# Patient Record
Sex: Male | Born: 1950 | Race: White | Hispanic: No | Marital: Married | Smoking: Former smoker
Health system: Southern US, Community
[De-identification: ages and names within clinical notes are randomized; demographics above are authoritative.]

## PROBLEM LIST (undated history)

## (undated) DIAGNOSIS — M1990 Unspecified osteoarthritis, unspecified site: Secondary | ICD-10-CM

## (undated) DIAGNOSIS — I10 Essential (primary) hypertension: Secondary | ICD-10-CM

## (undated) DIAGNOSIS — K219 Gastro-esophageal reflux disease without esophagitis: Secondary | ICD-10-CM

## (undated) DIAGNOSIS — I499 Cardiac arrhythmia, unspecified: Secondary | ICD-10-CM

## (undated) DIAGNOSIS — Z9889 Other specified postprocedural states: Secondary | ICD-10-CM

## (undated) DIAGNOSIS — Z8719 Personal history of other diseases of the digestive system: Secondary | ICD-10-CM

## (undated) DIAGNOSIS — R112 Nausea with vomiting, unspecified: Secondary | ICD-10-CM

## (undated) DIAGNOSIS — I219 Acute myocardial infarction, unspecified: Secondary | ICD-10-CM

## (undated) DIAGNOSIS — I2510 Atherosclerotic heart disease of native coronary artery without angina pectoris: Secondary | ICD-10-CM

## (undated) DIAGNOSIS — J45909 Unspecified asthma, uncomplicated: Secondary | ICD-10-CM

---

## 2015-03-25 DIAGNOSIS — S61305A Unspecified open wound of left ring finger with damage to nail, initial encounter: Secondary | ICD-10-CM | POA: Diagnosis not present

## 2015-03-25 DIAGNOSIS — S62609A Fracture of unspecified phalanx of unspecified finger, initial encounter for closed fracture: Secondary | ICD-10-CM | POA: Diagnosis not present

## 2015-07-26 DIAGNOSIS — Z23 Encounter for immunization: Secondary | ICD-10-CM | POA: Diagnosis not present

## 2016-07-31 DIAGNOSIS — Z23 Encounter for immunization: Secondary | ICD-10-CM | POA: Diagnosis not present

## 2016-11-16 ENCOUNTER — Encounter (HOSPITAL_COMMUNITY)
Admission: EM | Disposition: A | Discharge status: Home / Self Care | Payer: Self-pay | Source: Other Acute Inpatient Hospital | Attending: Thoracic Surgery (Cardiothoracic Vascular Surgery)

## 2016-11-16 ENCOUNTER — Encounter (HOSPITAL_COMMUNITY): Payer: Self-pay | Admitting: Physician Assistant

## 2016-11-16 ENCOUNTER — Inpatient Hospital Stay (HOSPITAL_COMMUNITY)
Admission: EM | Admit: 2016-11-16 | Discharge: 2016-11-24 | DRG: 234 | Disposition: A | Discharge status: Home / Self Care | Payer: Medicare Other | Source: Other Acute Inpatient Hospital | Attending: Thoracic Surgery (Cardiothoracic Vascular Surgery) | Admitting: Thoracic Surgery (Cardiothoracic Vascular Surgery)

## 2016-11-16 ENCOUNTER — Inpatient Hospital Stay (HOSPITAL_COMMUNITY): Payer: Medicare Other

## 2016-11-16 DIAGNOSIS — Z8249 Family history of ischemic heart disease and other diseases of the circulatory system: Secondary | ICD-10-CM

## 2016-11-16 DIAGNOSIS — I2510 Atherosclerotic heart disease of native coronary artery without angina pectoris: Secondary | ICD-10-CM | POA: Diagnosis present

## 2016-11-16 DIAGNOSIS — I10 Essential (primary) hypertension: Secondary | ICD-10-CM | POA: Diagnosis not present

## 2016-11-16 DIAGNOSIS — Z01810 Encounter for preprocedural cardiovascular examination: Secondary | ICD-10-CM

## 2016-11-16 DIAGNOSIS — I25110 Atherosclerotic heart disease of native coronary artery with unstable angina pectoris: Secondary | ICD-10-CM | POA: Diagnosis not present

## 2016-11-16 DIAGNOSIS — E785 Hyperlipidemia, unspecified: Secondary | ICD-10-CM | POA: Diagnosis present

## 2016-11-16 DIAGNOSIS — K5900 Constipation, unspecified: Secondary | ICD-10-CM | POA: Diagnosis not present

## 2016-11-16 DIAGNOSIS — R0789 Other chest pain: Secondary | ICD-10-CM | POA: Diagnosis not present

## 2016-11-16 DIAGNOSIS — J9811 Atelectasis: Secondary | ICD-10-CM

## 2016-11-16 DIAGNOSIS — I213 ST elevation (STEMI) myocardial infarction of unspecified site: Secondary | ICD-10-CM | POA: Diagnosis present

## 2016-11-16 DIAGNOSIS — I2111 ST elevation (STEMI) myocardial infarction involving right coronary artery: Secondary | ICD-10-CM | POA: Diagnosis present

## 2016-11-16 DIAGNOSIS — R600 Localized edema: Secondary | ICD-10-CM | POA: Diagnosis not present

## 2016-11-16 DIAGNOSIS — E8770 Fluid overload, unspecified: Secondary | ICD-10-CM | POA: Diagnosis not present

## 2016-11-16 DIAGNOSIS — I471 Supraventricular tachycardia: Secondary | ICD-10-CM | POA: Diagnosis not present

## 2016-11-16 DIAGNOSIS — I480 Paroxysmal atrial fibrillation: Secondary | ICD-10-CM | POA: Diagnosis not present

## 2016-11-16 DIAGNOSIS — K219 Gastro-esophageal reflux disease without esophagitis: Secondary | ICD-10-CM | POA: Diagnosis present

## 2016-11-16 DIAGNOSIS — Z951 Presence of aortocoronary bypass graft: Secondary | ICD-10-CM | POA: Diagnosis not present

## 2016-11-16 DIAGNOSIS — R338 Other retention of urine: Secondary | ICD-10-CM | POA: Diagnosis not present

## 2016-11-16 DIAGNOSIS — Z79899 Other long term (current) drug therapy: Secondary | ICD-10-CM

## 2016-11-16 DIAGNOSIS — Z09 Encounter for follow-up examination after completed treatment for conditions other than malignant neoplasm: Secondary | ICD-10-CM

## 2016-11-16 DIAGNOSIS — N401 Enlarged prostate with lower urinary tract symptoms: Secondary | ICD-10-CM | POA: Diagnosis present

## 2016-11-16 DIAGNOSIS — R079 Chest pain, unspecified: Secondary | ICD-10-CM

## 2016-11-16 DIAGNOSIS — K449 Diaphragmatic hernia without obstruction or gangrene: Secondary | ICD-10-CM | POA: Diagnosis present

## 2016-11-16 DIAGNOSIS — I2119 ST elevation (STEMI) myocardial infarction involving other coronary artery of inferior wall: Principal | ICD-10-CM | POA: Diagnosis present

## 2016-11-16 DIAGNOSIS — D62 Acute posthemorrhagic anemia: Secondary | ICD-10-CM | POA: Diagnosis not present

## 2016-11-16 DIAGNOSIS — I340 Nonrheumatic mitral (valve) insufficiency: Secondary | ICD-10-CM | POA: Diagnosis not present

## 2016-11-16 HISTORY — DX: Unspecified asthma, uncomplicated: J45.909

## 2016-11-16 HISTORY — PX: LEFT HEART CATH AND CORONARY ANGIOGRAPHY: CATH118249

## 2016-11-16 HISTORY — DX: Essential (primary) hypertension: I10

## 2016-11-16 HISTORY — DX: Gastro-esophageal reflux disease without esophagitis: K21.9

## 2016-11-16 LAB — COMPREHENSIVE METABOLIC PANEL
ALBUMIN: 3.9 g/dL (ref 3.5–5.0)
ALK PHOS: 75 U/L (ref 38–126)
ALT: 25 U/L (ref 17–63)
ANION GAP: 8 (ref 5–15)
AST: 35 U/L (ref 15–41)
BILIRUBIN TOTAL: 0.8 mg/dL (ref 0.3–1.2)
BUN: 17 mg/dL (ref 6–20)
CALCIUM: 8.5 mg/dL — AB (ref 8.9–10.3)
CO2: 23 mmol/L (ref 22–32)
Chloride: 107 mmol/L (ref 101–111)
Creatinine, Ser: 1.25 mg/dL — ABNORMAL HIGH (ref 0.61–1.24)
GFR calc non Af Amer: 58 mL/min — ABNORMAL LOW (ref >60)
Glucose, Bld: 126 mg/dL — ABNORMAL HIGH (ref 65–99)
POTASSIUM: 3.9 mmol/L (ref 3.5–5.1)
Sodium: 138 mmol/L (ref 135–145)
TOTAL PROTEIN: 6.5 g/dL (ref 6.5–8.1)

## 2016-11-16 LAB — POCT I-STAT, CHEM 8
BUN: 19 mg/dL (ref 6–20)
CHLORIDE: 105 mmol/L (ref 101–111)
CREATININE: 1.2 mg/dL (ref 0.61–1.24)
Calcium, Ion: 1.21 mmol/L (ref 1.15–1.40)
GLUCOSE: 129 mg/dL — AB (ref 65–99)
HCT: 37 % — ABNORMAL LOW (ref 39.0–52.0)
Hemoglobin: 12.6 g/dL — ABNORMAL LOW (ref 13.0–17.0)
POTASSIUM: 3.8 mmol/L (ref 3.5–5.1)
Sodium: 141 mmol/L (ref 135–145)
TCO2: 24 mmol/L (ref 0–100)

## 2016-11-16 LAB — TYPE AND SCREEN
ABO/RH(D): O POS
Antibody Screen: NEGATIVE

## 2016-11-16 LAB — LIPID PANEL
CHOL/HDL RATIO: 6.6 ratio
Cholesterol: 191 mg/dL (ref 0–200)
HDL: 29 mg/dL — AB (ref >40)
LDL Cholesterol: 137 mg/dL — ABNORMAL HIGH (ref 0–99)
Triglycerides: 127 mg/dL (ref <150)
VLDL: 25 mg/dL (ref 0–40)

## 2016-11-16 LAB — VAS US DOPPLER PRE CABG
LCCADDIAS: -17 cm/s
LCCADSYS: -63 cm/s
LCCAPDIAS: -22 cm/s
LEFT ECA DIAS: -17 cm/s
LEFT VERTEBRAL DIAS: 13 cm/s
LICADDIAS: -18 cm/s
LICADSYS: -53 cm/s
LICAPDIAS: -14 cm/s
LICAPSYS: -62 cm/s
Left CCA prox sys: -98 cm/s
RCCAPSYS: 76 cm/s
RIGHT ECA DIAS: -16 cm/s
RIGHT VERTEBRAL DIAS: 20 cm/s
Right CCA prox dias: 14 cm/s
Right cca dist sys: -68 cm/s

## 2016-11-16 LAB — URINALYSIS, ROUTINE W REFLEX MICROSCOPIC
Bilirubin Urine: NEGATIVE
GLUCOSE, UA: NEGATIVE mg/dL
HGB URINE DIPSTICK: NEGATIVE
KETONES UR: NEGATIVE mg/dL
Leukocytes, UA: NEGATIVE
Nitrite: NEGATIVE
PH: 6 (ref 5.0–8.0)
PROTEIN: NEGATIVE mg/dL
Specific Gravity, Urine: 1.02 (ref 1.005–1.030)

## 2016-11-16 LAB — CBC
HEMATOCRIT: 38.9 % — AB (ref 39.0–52.0)
Hemoglobin: 13.2 g/dL (ref 13.0–17.0)
MCH: 29.7 pg (ref 26.0–34.0)
MCHC: 33.9 g/dL (ref 30.0–36.0)
MCV: 87.6 fL (ref 78.0–100.0)
PLATELETS: 182 10*3/uL (ref 150–400)
RBC: 4.44 MIL/uL (ref 4.22–5.81)
RDW: 12.7 % (ref 11.5–15.5)
WBC: 8.6 10*3/uL (ref 4.0–10.5)

## 2016-11-16 LAB — TSH: TSH: 1.585 u[IU]/mL (ref 0.350–4.500)

## 2016-11-16 LAB — PROTIME-INR
INR: 1.11
INR: 0.98
PROTHROMBIN TIME: 12.9 s (ref 11.4–15.2)
Prothrombin Time: 14.3 seconds (ref 11.4–15.2)

## 2016-11-16 LAB — POCT ACTIVATED CLOTTING TIME: Activated Clotting Time: 257 seconds

## 2016-11-16 LAB — CARDIAC CATHETERIZATION: Cath EF Quantitative: 45 %

## 2016-11-16 LAB — TROPONIN I
Troponin I: 1.31 ng/mL (ref <0.03)
Troponin I: 8.89 ng/mL (ref <0.03)
Troponin I: 23.58 ng/mL (ref <0.03)

## 2016-11-16 LAB — MRSA PCR SCREENING: MRSA BY PCR: NEGATIVE

## 2016-11-16 LAB — APTT

## 2016-11-16 LAB — ABO/RH: ABO/RH(D): O POS

## 2016-11-16 SURGERY — LEFT HEART CATH AND CORONARY ANGIOGRAPHY

## 2016-11-16 MED ORDER — EPINEPHRINE PF 1 MG/ML IJ SOLN
0.0000 ug/min | INTRAVENOUS | Status: DC
  Filled 2016-11-16: qty 4

## 2016-11-16 MED ORDER — ACETAMINOPHEN 325 MG PO TABS: 650.0000 mg | ORAL_TABLET | ORAL | Status: DC | PRN

## 2016-11-16 MED ORDER — NITROGLYCERIN IN D5W 200-5 MCG/ML-% IV SOLN
2.0000 ug/min | INTRAVENOUS | Status: AC
  Administered 2016-11-17: 10 ug/min via INTRAVENOUS
  Filled 2016-11-16: qty 250

## 2016-11-16 MED ORDER — IOPAMIDOL (ISOVUE-370) INJECTION 76%
INTRAVENOUS | Status: DC | PRN
  Administered 2016-11-16: 75 mL via INTRA_ARTERIAL

## 2016-11-16 MED ORDER — HEPARIN (PORCINE) IN NACL 2-0.9 UNIT/ML-% IJ SOLN
INTRAMUSCULAR | Status: DC | PRN
  Administered 2016-11-16: 1000 mL

## 2016-11-16 MED ORDER — CHLORHEXIDINE GLUCONATE CLOTH 2 % EX PADS
6.0000 | MEDICATED_PAD | Freq: Once | CUTANEOUS | Status: AC
  Administered 2016-11-17: 6 via TOPICAL

## 2016-11-16 MED ORDER — HEPARIN (PORCINE) IN NACL 2-0.9 UNIT/ML-% IJ SOLN
INTRAMUSCULAR | Status: AC
Start: 2016-11-16 — End: 2016-11-16
  Filled 2016-11-16: qty 1000

## 2016-11-16 MED ORDER — METOPROLOL TARTRATE 25 MG PO TABS
25.0000 mg | ORAL_TABLET | Freq: Two times a day (BID) | ORAL | Status: DC
  Administered 2016-11-16: 25 mg via ORAL
  Filled 2016-11-16: qty 1

## 2016-11-16 MED ORDER — DEXTROSE 5 % IV SOLN
750.0000 mg | INTRAVENOUS | Status: DC
  Filled 2016-11-16: qty 750

## 2016-11-16 MED ORDER — CHLORHEXIDINE GLUCONATE CLOTH 2 % EX PADS
6.0000 | MEDICATED_PAD | Freq: Once | CUTANEOUS | Status: AC
  Administered 2016-11-16: 6 via TOPICAL

## 2016-11-16 MED ORDER — NITROGLYCERIN IN D5W 200-5 MCG/ML-% IV SOLN: 0.0000 ug/min | INTRAVENOUS | Status: DC

## 2016-11-16 MED ORDER — BISACODYL 5 MG PO TBEC
5.0000 mg | DELAYED_RELEASE_TABLET | Freq: Once | ORAL | Status: AC
  Administered 2016-11-16: 5 mg via ORAL
  Filled 2016-11-16: qty 1

## 2016-11-16 MED ORDER — IOPAMIDOL (ISOVUE-370) INJECTION 76%
INTRAVENOUS | Status: AC
  Filled 2016-11-16: qty 125

## 2016-11-16 MED ORDER — ALPRAZOLAM 0.25 MG PO TABS: 0.2500 mg | ORAL_TABLET | Freq: Two times a day (BID) | ORAL | Status: DC | PRN

## 2016-11-16 MED ORDER — NITROGLYCERIN 0.4 MG SL SUBL: 0.4000 mg | SUBLINGUAL_TABLET | SUBLINGUAL | Status: DC | PRN

## 2016-11-16 MED ORDER — TRANEXAMIC ACID 1000 MG/10ML IV SOLN
1.5000 mg/kg/h | INTRAVENOUS | Status: AC
  Administered 2016-11-17: 1.5 mg/kg/h via INTRAVENOUS
  Filled 2016-11-16: qty 25

## 2016-11-16 MED ORDER — ATORVASTATIN CALCIUM 80 MG PO TABS
80.0000 mg | ORAL_TABLET | Freq: Every day | ORAL | Status: DC
  Administered 2016-11-16 – 2016-11-23 (×7): 80 mg via ORAL
  Filled 2016-11-16 (×7): qty 1

## 2016-11-16 MED ORDER — ASPIRIN 81 MG PO CHEW: 81.0000 mg | CHEWABLE_TABLET | Freq: Every day | ORAL | Status: DC

## 2016-11-16 MED ORDER — VERAPAMIL HCL 2.5 MG/ML IV SOLN
INTRAVENOUS | Status: DC | PRN
  Administered 2016-11-16: 10 mL via INTRA_ARTERIAL

## 2016-11-16 MED ORDER — ZOLPIDEM TARTRATE 5 MG PO TABS: 5.0000 mg | ORAL_TABLET | Freq: Every evening | ORAL | Status: DC | PRN

## 2016-11-16 MED ORDER — LIDOCAINE HCL (PF) 1 % IJ SOLN
INTRAMUSCULAR | Status: DC | PRN
  Administered 2016-11-16: 2 mL

## 2016-11-16 MED ORDER — NITROGLYCERIN IN D5W 200-5 MCG/ML-% IV SOLN
INTRAVENOUS | Status: DC | PRN
  Administered 2016-11-16: 10 ug/min via INTRAVENOUS

## 2016-11-16 MED ORDER — DIAZEPAM 5 MG PO TABS
5.0000 mg | ORAL_TABLET | Freq: Once | ORAL | Status: AC
  Administered 2016-11-17: 5 mg via ORAL
  Filled 2016-11-16: qty 1

## 2016-11-16 MED ORDER — DEXMEDETOMIDINE HCL IN NACL 400 MCG/100ML IV SOLN
0.1000 ug/kg/h | INTRAVENOUS | Status: AC
  Administered 2016-11-17: .3 ug/kg/h via INTRAVENOUS
  Filled 2016-11-16: qty 100

## 2016-11-16 MED ORDER — SODIUM CHLORIDE 0.9 % IV SOLN
30.0000 ug/min | INTRAVENOUS | Status: AC
  Administered 2016-11-17: 20 ug/min via INTRAVENOUS
  Administered 2016-11-17: 10 ug/min via INTRAVENOUS
  Filled 2016-11-16: qty 2

## 2016-11-16 MED ORDER — ONDANSETRON HCL 4 MG/2ML IJ SOLN: 4.0000 mg | Freq: Four times a day (QID) | INTRAMUSCULAR | Status: DC | PRN

## 2016-11-16 MED ORDER — NITROGLYCERIN IN D5W 200-5 MCG/ML-% IV SOLN
INTRAVENOUS | Status: AC
  Filled 2016-11-16: qty 250

## 2016-11-16 MED ORDER — DOPAMINE-DEXTROSE 3.2-5 MG/ML-% IV SOLN
0.0000 ug/kg/min | INTRAVENOUS | Status: DC
  Filled 2016-11-16: qty 250

## 2016-11-16 MED ORDER — PLASMA-LYTE 148 IV SOLN
INTRAVENOUS | Status: AC
  Administered 2016-11-17: 500 mL
  Filled 2016-11-16: qty 2.5

## 2016-11-16 MED ORDER — MORPHINE SULFATE (PF) 2 MG/ML IV SOLN: 2.0000 mg | INTRAVENOUS | Status: DC | PRN

## 2016-11-16 MED ORDER — SODIUM CHLORIDE 0.9 % IV SOLN
INTRAVENOUS | Status: DC | PRN
  Administered 2016-11-16: 150 mL/h via INTRAVENOUS

## 2016-11-16 MED ORDER — CHLORHEXIDINE GLUCONATE 0.12 % MT SOLN
15.0000 mL | Freq: Once | OROMUCOSAL | Status: AC
  Administered 2016-11-17: 15 mL via OROMUCOSAL
  Filled 2016-11-16: qty 15

## 2016-11-16 MED ORDER — LIDOCAINE HCL (PF) 1 % IJ SOLN
INTRAMUSCULAR | Status: AC
  Filled 2016-11-16: qty 30

## 2016-11-16 MED ORDER — DEXTROSE 5 % IV SOLN
1.5000 g | INTRAVENOUS | Status: AC
  Administered 2016-11-17: .75 g via INTRAVENOUS
  Administered 2016-11-17: 1.5 g via INTRAVENOUS
  Filled 2016-11-16: qty 1.5

## 2016-11-16 MED ORDER — POTASSIUM CHLORIDE 2 MEQ/ML IV SOLN
80.0000 meq | INTRAVENOUS | Status: DC
  Filled 2016-11-16: qty 40

## 2016-11-16 MED ORDER — TRANEXAMIC ACID (OHS) BOLUS VIA INFUSION
15.0000 mg/kg | INTRAVENOUS | Status: AC
  Administered 2016-11-17: 1414.5 mg via INTRAVENOUS
  Filled 2016-11-16: qty 1415

## 2016-11-16 MED ORDER — HEPARIN (PORCINE) IN NACL 100-0.45 UNIT/ML-% IJ SOLN
1150.0000 [IU]/h | INTRAMUSCULAR | Status: DC
  Administered 2016-11-16: 1150 [IU]/h via INTRAVENOUS
  Filled 2016-11-16: qty 250

## 2016-11-16 MED ORDER — SODIUM CHLORIDE 0.9% FLUSH
3.0000 mL | Freq: Two times a day (BID) | INTRAVENOUS | Status: DC
  Administered 2016-11-16: 3 mL via INTRAVENOUS

## 2016-11-16 MED ORDER — SODIUM CHLORIDE 0.9 % IV SOLN
INTRAVENOUS | Status: DC
  Filled 2016-11-16: qty 30

## 2016-11-16 MED ORDER — TRANEXAMIC ACID (OHS) PUMP PRIME SOLUTION
2.0000 mg/kg | INTRAVENOUS | Status: DC
  Filled 2016-11-16: qty 1.89

## 2016-11-16 MED ORDER — NITROGLYCERIN 1 MG/10 ML FOR IR/CATH LAB
INTRA_ARTERIAL | Status: AC
  Filled 2016-11-16: qty 10

## 2016-11-16 MED ORDER — VANCOMYCIN HCL 10 G IV SOLR
1500.0000 mg | INTRAVENOUS | Status: AC
  Administered 2016-11-17: 1500 mg via INTRAVENOUS
  Filled 2016-11-16: qty 1500

## 2016-11-16 MED ORDER — SODIUM CHLORIDE 0.9 % IV SOLN: 250.0000 mL | INTRAVENOUS | Status: DC | PRN

## 2016-11-16 MED ORDER — SODIUM CHLORIDE 0.9 % IV SOLN
INTRAVENOUS | Status: AC
  Administered 2016-11-17: .8 [IU]/h via INTRAVENOUS
  Filled 2016-11-16: qty 2.5

## 2016-11-16 MED ORDER — MAGNESIUM SULFATE 50 % IJ SOLN
40.0000 meq | INTRAMUSCULAR | Status: DC
Start: 2016-11-17 — End: 2016-11-17
  Filled 2016-11-16: qty 10

## 2016-11-16 MED ORDER — METOPROLOL TARTRATE 12.5 MG HALF TABLET: 12.5000 mg | ORAL_TABLET | Freq: Once | ORAL | Status: DC

## 2016-11-16 MED ORDER — HEPARIN SODIUM (PORCINE) 1000 UNIT/ML IJ SOLN
INTRAMUSCULAR | Status: DC | PRN
  Administered 2016-11-16: 5000 [IU] via INTRAVENOUS

## 2016-11-16 MED ORDER — HEPARIN SODIUM (PORCINE) 1000 UNIT/ML IJ SOLN
INTRAMUSCULAR | Status: AC
  Filled 2016-11-16: qty 1

## 2016-11-16 MED ORDER — SODIUM CHLORIDE 0.9% FLUSH: 3.0000 mL | INTRAVENOUS | Status: DC | PRN

## 2016-11-16 MED ORDER — SODIUM CHLORIDE 0.9 % WEIGHT BASED INFUSION
1.0000 mL/kg/h | INTRAVENOUS | Status: DC
  Administered 2016-11-16: 1 mL/kg/h via INTRAVENOUS

## 2016-11-16 MED ORDER — SODIUM CHLORIDE 0.9% FLUSH
3.0000 mL | Freq: Two times a day (BID) | INTRAVENOUS | Status: DC
  Administered 2016-11-16 (×2): 3 mL via INTRAVENOUS

## 2016-11-16 MED ORDER — VERAPAMIL HCL 2.5 MG/ML IV SOLN
INTRAVENOUS | Status: AC
  Filled 2016-11-16: qty 2

## 2016-11-16 SURGICAL SUPPLY — 13 items
CATH EXPO 5F FL3.5 (CATHETERS) ×2 IMPLANT
CATH EXPO 5FR FR4 (CATHETERS) ×2 IMPLANT
CATH INFINITI 5FR ANG PIGTAIL (CATHETERS) ×2 IMPLANT
DEVICE RAD COMP TR BAND LRG (VASCULAR PRODUCTS) ×2 IMPLANT
GLIDESHEATH SLEND SS 6F .021 (SHEATH) ×2 IMPLANT
GUIDEWIRE INQWIRE 1.5J.035X260 (WIRE) ×1 IMPLANT
INQWIRE 1.5J .035X260CM (WIRE) ×2
KIT ENCORE 26 ADVANTAGE (KITS) ×2 IMPLANT
KIT HEART LEFT (KITS) ×2 IMPLANT
PACK CARDIAC CATHETERIZATION (CUSTOM PROCEDURE TRAY) ×2 IMPLANT
SYR MEDRAD MARK V 150ML (SYRINGE) ×2 IMPLANT
TRANSDUCER W/STOPCOCK (MISCELLANEOUS) ×2 IMPLANT
TUBING CIL FLEX 10 FLL-RA (TUBING) ×2 IMPLANT

## 2016-11-16 NOTE — H&P (Signed)
History and Physical   Patient ID: Dennis Paul MRN: [id number], DOB/AGE: 10-14-1950 66 y.o. Date of Encounter: 11/16/2016  Primary Physician: No primary care provider on file. Primary Cardiologist: New  Chief Complaint:  STEMI  HPI: Dennis Paul is a 66 y.o. male with a history of Hypertension and reflux, childhood asthma.  He has recently had an upper respiratory infection, both he and his wife have been sick.  He has had 3 episodes of chest pain in the last week. The first episode started without significant exertion, it was brief and resolved spontaneously. Last p.m., he had another episode that started with mild exertion. It was substernal and associated with some shortness of breath. He stopped what he was doing, rested, and the chest pain resolved. He did not try any medications.  Today, he went out to start his truck, and had onset of substernal chest pain. It reached an 8/10. It radiated to his left arm. It was associated with shortness of breath, diaphoresis and maybe a little nausea. He came back into the house, but the symptoms did not resolve. He went to the [facility] ER, where his ECG was consistent with a STEMI. Treated in the emergency room with 325 mg of aspirin, heparin 5000 units and nitroglycerin paste to the right side of his chest.  His chest pain improved with the medications, but it was still abnormal. He was also having frequent ventricular ectopy. He was transferred emergently to [facility] and taken directly to the Cath Lab.   Past Medical History:  Diagnosis Date  . Asthma    had as a child, wheezes now w/ URI  . GERD (gastroesophageal reflux disease)   . Hiatal hernia   . HTN (hypertension)     Surgical History: No past surgical history on file.   I have reviewed the patient's current medications. Prior to Admission medications   Omeprazole and an unknown medication for his blood pressure, possibly amlodipine 10 mg    Allergies: No known drug  allergies  Social History   Social History  . Marital status: Married    Spouse name: N/A  . Number of children: N/A  . Years of education: N/A   Occupational History  . Retired    Social History Main Topics  . Smoking status: Never Smoker  . Smokeless tobacco: Never Used  . Alcohol use No  . Drug use: No  . Sexual activity: Not on file   Other Topics Concern  . Not on file   Social History Narrative   Lives in [place] with family    Family History  Problem Relation Age of Onset  . Hypertension Mother   . Hypertension Father    Family Status  Relation Status  . Mother Deceased  . Father Deceased    Review of Systems:   Full 14-point review of systems otherwise negative except as noted above.  Physical Exam: Vital signs by EMS, blood pressure 165/105, heart rate 75, respiratory rate 18, blood glucose 126 General: Well developed, well nourished,male in no acute distress. Head: Normocephalic, atraumatic, sclera non-icteric, no xanthomas, nares are without discharge. Dentition: Fair Neck: No carotid bruits. JVD not elevated. No thyromegally Lungs: Good expansion bilaterally. Without rhonchi. Mild expiratory wheezing noted Heart: Regular rate and rhythm with S1 S2.  No S3 or S4.  No murmur, no rubs, or gallops appreciated. Abdomen: Soft, non-tender, non-distended with normoactive bowel sounds. No hepatomegaly. No rebound/guarding. No obvious abdominal masses. Msk:  Strength  and tone appear normal for age. No joint deformities or effusions, no spine or costo-vertebral angle tenderness. Extremities: No clubbing or cyanosis. No edema.  Distal pedal pulses are 2+ in 4 extrem Neuro: Alert and oriented X 3. Moves all extremities spontaneously. No focal deficits noted. Psych:  Responds to questions appropriately with a normal affect. Skin: No rashes or lesions noted  Labs: CMET, CBC and trop[facility] Troponin 0.05, other labs are currently available,  they will fax to the Cath Lab  Radiology/Studies: No results found.   Cardiac Cath:N/A Echo: N/A  ECG: Sinus rhythm, frequent ventricular ectopy, inferior ST elevation  ASSESSMENT AND PLAN:  Principal Problem:   ST elevation myocardial infarction (STEMI) of inferior wall, initial episode of care [facility](HCC) - Patient taken directly to the cath lab with further evaluation and treatment depending on the results - I will go ahead and add high-dose statin - Check lipids - I will hold off on adding a beta blocker, he has a mild wheeze on exam.  Active Problems:   HTN (hypertension) - His blood pressure is elevated. - We will need to get his wife to bring in or call with his home blood pressure medication. - Dr. SwazilandJordan to determine additional blood pressure medications    GERD (gastroesophageal reflux disease) - He was on omeprazole prior to admission, we'll start Protonix   Signed, Theodore DemarkBarrett, Rhonda, PA-C 11/16/2016 1:20 PM Beeper [phone]  Patient seen and examined and history reviewed. Agree with above findings and plan. 66  Yo WM with history of HTN presents in [facility] ED for inferior STEMI. Reports 2 episodes of chest pain earlier in the week resolved with rest. Today pain returned and did not resolve. In ED Ecg showed hyperacute ST elevation in the inferior leads with frequent PVCs. He was given MSO4 and IV heparin and transferred here. On arrival he is now pain free. Ecg shows improvement in ST elevation. Will proceed with emergent cardiac cath.   Linn Clavin SwazilandJordan, MDFACC 11/16/2016 2:30 PM

## 2016-11-16 NOTE — Progress Notes (Signed)
Pre-op Cardiac Surgery  Carotid Findings:  Bilateral: No significant (1-39%) ICA stenosis. Antegrade vertebral flow.    Upper Extremity Right Left  Brachial Pressures 115 119  Radial Waveforms Tri Tri  Ulnar Waveforms Tri Tri  Palmar Arch (Allen's Test) Normal  Normal    Findings:  Pedal artery waveforms within normal limits.   Farrel DemarkJill Eunice, RDMS, RVT 11/16/2016

## 2016-11-16 NOTE — Progress Notes (Signed)
ANTICOAGULATION CONSULT NOTE - Initial Consult  Pharmacy Consult for Heparin Indication: severe 3v CAD  Allergies no known allergies  Patient Measurements: Height: 5\' 9"  (175.3 cm) Weight: 208 lb (94.3 kg) IBW/kg (Calculated) : 70.7 Heparin Dosing Weight: 90kg  Vital Signs: BP: 156/88 (02/08 1344) Pulse Rate: 77 (02/08 1344)  Labs:  Recent Labs  11/16/16 1319 11/16/16 1322  HGB 12.6* 13.2  HCT 37.0* 38.9*  PLT  --  182  APTT  --  >200*  LABPROT  --  14.3  INR  --  1.11  CREATININE 1.20 1.25*  TROPONINI  --  1.31*    Estimated Creatinine Clearance: 65.9 mL/min (by C-G formula based on SCr of 1.25 mg/dL (H)).   Medical History: Past Medical History:  Diagnosis Date  . Asthma    had as a child, wheezes now w/ URI  . GERD (gastroesophageal reflux disease)   . Hiatal hernia   . 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 Heparin to start post-cath pending CVTS consult for CABG. Radial sheath removed and TR band applied at 1335. Baseline CBC wnl.  Goal of Therapy:  Heparin level 0.3-0.7 units/ml Monitor platelets by anticoagulation protocol: Yes   Plan:  1) At 2130, begin heparin at 1150 units/hr, no bolus 2) Check 6 hour heparin level 3) Daily heparin level and CBC  Fredrik RiggerMarkle, Shermeka Rutt Sue 11/16/2016,2:48 PM

## 2016-11-16 NOTE — Consult Note (Signed)
Reason for Consult:3 vessel CAD s/p STEMI Referring Physician: Dr. Martinique  Dak Bramhall is an 66 y.o. male.  HPI: 66 yo man presented with a cc/o CP  Mr. Guerette is a 66 yo man with a past history significant for hypertension and GERD. He has no prior cardiac history. He has been poorly for the past week with cough, nasal congestion, but no fevers. He has had 3 episodes of Cp in the past week. The first 2 were relatively minor and resolved without incident. This morning he went out to his truck and had sudden onset of severe substernal CP radiating to his left arm. He had some nausea and became diaphoretic. He went to the [facility] ED and ECG showed ST elevation. He was given ASA, heparin, NTG, morphine and transferred directly to the cath lab. Pain resolved prior to arriving in cath lab.  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 He currently is pain free.  Past Medical History:  Diagnosis Date  . Asthma    had as a child, wheezes now w/ URI  . GERD (gastroesophageal reflux disease)   . Hiatal hernia   . HTN (hypertension)     Past Surgical History:  Procedure Laterality Date  . LEFT HEART CATH AND CORONARY ANGIOGRAPHY N/A 11/16/2016   Procedure: Left Heart Cath and Coronary Angiography;  Surgeon: Peter M Martinique, MD;  Location: [facility];  Service: Cardiovascular;  Laterality: N/A;    Family History  Problem Relation Age of Onset  . Hypertension Mother   . Hypertension Father     Social History:  reports that he has never smoked. He has never used smokeless tobacco. He reports that he does not drink alcohol or use drugs.  Allergies: No Known Allergies  Medications:  Prior to Admission:  No prescriptions prior to admission.    Results for orders placed or performed during the hospital encounter of 11/16/16 (from the past 48 hour(s))  POCT Activated clotting time     Status: None   Collection Time: 11/16/16  1:18 PM  Result Value Ref  Range   Activated Clotting Time 257 seconds  I-STAT, chem 8     Status: Abnormal   Collection Time: 11/16/16  1:19 PM  Result Value Ref Range   Sodium 141 135 - 145 mmol/L   Potassium 3.8 3.5 - 5.1 mmol/L   Chloride 105 101 - 111 mmol/L   BUN 19 6 - 20 mg/dL   Creatinine, Ser 1.20 0.61 - 1.24 mg/dL   Glucose, Bld 129 (H) 65 - 99 mg/dL   Calcium, Ion 1.21 1.15 - 1.40 mmol/L   TCO2 24 0 - 100 mmol/L   Hemoglobin 12.6 (L) 13.0 - 17.0 g/dL   HCT 37.0 (L) 39.0 - 52.0 %  Comprehensive metabolic panel     Status: Abnormal   Collection Time: 11/16/16  1:22 PM  Result Value Ref Range   Sodium 138 135 - 145 mmol/L   Potassium 3.9 3.5 - 5.1 mmol/L   Chloride 107 101 - 111 mmol/L   CO2 23 22 - 32 mmol/L   Glucose, Bld 126 (H) 65 - 99 mg/dL   BUN 17 6 - 20 mg/dL   Creatinine, Ser 1.25 (H) 0.61 - 1.24 mg/dL   Calcium 8.5 (L) 8.9 - 10.3 mg/dL   Total Protein 6.5 6.5 - 8.1 g/dL   Albumin 3.9 3.5 - 5.0 g/dL   AST 35 15 - 41 U/L   ALT 25 17 -  63 U/L   Alkaline Phosphatase 75 38 - 126 U/L   Total Bilirubin 0.8 0.3 - 1.2 mg/dL   GFR calc non Af Amer 58 (L) >60 mL/min   GFR calc Af Amer >60 >60 mL/min    Comment: (NOTE) The eGFR has been calculated using the CKD EPI equation. This calculation has not been validated in all clinical situations. eGFR's persistently <60 mL/min signify possible Chronic Kidney Disease.    Anion gap 8 5 - 15  Lipid panel     Status: Abnormal   Collection Time: 11/16/16  1:22 PM  Result Value Ref Range   Cholesterol 191 0 - 200 mg/dL   Triglycerides 127 <150 mg/dL   HDL 29 (L) >40 mg/dL   Total CHOL/HDL Ratio 6.6 RATIO   VLDL 25 0 - 40 mg/dL   LDL Cholesterol 137 (H) 0 - 99 mg/dL    Comment:        Total Cholesterol/HDL:CHD Risk Coronary Heart Disease Risk Table                     Men   Women  1/2 Average Risk   3.4   3.3  Average Risk       5.0   4.4  2 X Average Risk   9.6   7.1  3 X Average Risk  23.4   11.0        Use the calculated Patient  Ratio above and the CHD Risk Table to determine the patient's CHD Risk.        ATP III CLASSIFICATION (LDL):  <100     mg/dL   Optimal  100-129  mg/dL   Near or Above                    Optimal  130-159  mg/dL   Borderline  160-189  mg/dL   High  >190     mg/dL   Very High   CBC     Status: Abnormal   Collection Time: 11/16/16  1:22 PM  Result Value Ref Range   WBC 8.6 4.0 - 10.5 K/uL   RBC 4.44 4.22 - 5.81 MIL/uL   Hemoglobin 13.2 13.0 - 17.0 g/dL   HCT 38.9 (L) 39.0 - 52.0 %   MCV 87.6 78.0 - 100.0 fL   MCH 29.7 26.0 - 34.0 pg   MCHC 33.9 30.0 - 36.0 g/dL   RDW 12.7 11.5 - 15.5 %   Platelets 182 150 - 400 K/uL  Protime-INR     Status: None   Collection Time: 11/16/16  1:22 PM  Result Value Ref Range   Prothrombin Time 14.3 11.4 - 15.2 seconds   INR 1.11   APTT     Status: Abnormal   Collection Time: 11/16/16  1:22 PM  Result Value Ref Range   aPTT >200 (HH) 24 - 36 seconds    Comment: REPEATED TO VERIFY CRITICAL RESULT CALLED TO, READ BACK BY AND VERIFIED WITH: S.JACKSON,RN 11/16/16 '@1443'$  BY V.WILKINS   Troponin I     Status: Abnormal   Collection Time: 11/16/16  1:22 PM  Result Value Ref Range   Troponin I 1.31 (HH) <0.03 ng/mL    Comment: CRITICAL RESULT CALLED TO, READ BACK BY AND VERIFIED WITH: OLEARY,T RN @ 8242 11/16/16 LEONARD,A   MRSA PCR Screening     Status: None   Collection Time: 11/16/16  2:41 PM  Result Value Ref Range   MRSA by PCR  NEGATIVE NEGATIVE    Comment:        The GeneXpert MRSA Assay (FDA approved for NASAL specimens only), is one component of a comprehensive MRSA colonization surveillance program. It is not intended to diagnose MRSA infection nor to guide or monitor treatment for MRSA infections.     CARDIAC CATH Conclusion     Mid RCA lesion, 70 %stenosed.  RPDA lesion, 95 %stenosed.  Prox Cx to Mid Cx lesion, 50 %stenosed.  Prox LAD lesion, 90 %stenosed.  Mid LAD lesion, 50 %stenosed.  Ost 2nd Diag lesion, 90  %stenosed.  There is mild to moderate left ventricular systolic dysfunction.  LV end diastolic pressure is normal.   1. Severe 3 vessel obstructive CAD 2. Mild to moderate LV dysfunction 3. 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 The patient is now pain free and ST elevation is improved. He has complex 3 vessel disease with ulcerated plaque in the proximal LAD and bifurcation disease at the take off of a large diagonal branch. Given the complexity of disease I think he would be best managed with CABG. Will maximize his medical therapy and consult CT surgery.      I personally reviewed the cath images and concur with the findings noted above  Review of Systems  Constitutional: Positive for malaise/fatigue. Negative for chills and fever.  Respiratory: Positive for cough and wheezing.   Cardiovascular: Positive for chest pain and palpitations. Negative for orthopnea, claudication, leg swelling and PND.  Gastrointestinal: Positive for heartburn and nausea. Negative for blood in stool.  Genitourinary: Negative for dysuria and urgency.  Neurological: Negative for focal weakness, seizures and loss of consciousness.  Endo/Heme/Allergies: Does not bruise/bleed easily.  All other systems reviewed and are negative.  Blood pressure 108/78, pulse (!) 56, temperature 98.1 F (36.7 C), temperature source Oral, resp. rate (!) 9, height '5\' 9"'$  (1.753 m), weight 207 lb 14.3 oz (94.3 kg), SpO2 97 %. Physical Exam  Vitals reviewed. Constitutional: He is oriented to person, place, and time. He appears well-developed and well-nourished. No distress.  HENT:  Head: Normocephalic and atraumatic.  Mouth/Throat: No oropharyngeal exudate.  Eyes: Conjunctivae and EOM are normal. No scleral icterus.  Neck: Neck supple. No thyromegaly present.  Cardiovascular: Normal rate, regular rhythm, normal heart sounds and intact distal pulses.  Exam reveals no gallop and no friction rub.    No murmur heard. Respiratory: Breath sounds normal. No respiratory distress. He has no wheezes. He has no rales.  GI: Soft. He exhibits no distension. There is no tenderness.  Musculoskeletal: Normal range of motion. He exhibits no edema.  Lymphadenopathy:    He has no cervical adenopathy.  Neurological: He is alert and oriented to person, place, and time. No cranial nerve deficit.  No focal motor deficits  Skin: Skin is warm and dry.    Assessment/Plan: 65 yo man with no prior cardiac history presents with a STEMI. Fortunately the MI aborted with initiation of medical therapy. At cath he has severe 3 vessel CAD with inferior akinesis. CABG is indicated for survival benefit and relief of symptoms.  I have discussed the general nature of the procedure, the need for general anesthesia, the use of cardiopulmonary bypass, and the incisions to be used with Mr. Kasler. We discussed the expected hospital stay, overall recovery and short and long term outcomes. I informed him of the indications, risks, benefits and alternatives. He understands the risks include, but are not limited to  death, stroke, MI, DVT/PE, bleeding, possible need for transfusion, infections, cardiac arrhythmias, as well as other organ system dysfunction including respiratory, renal, or GI complications.  He accepts the risks and agrees to proceed.  For CABG in AM  Melrose Nakayama 11/16/2016, 4:55 PM

## 2016-11-16 NOTE — Progress Notes (Signed)
Incentive spirometer given to patient and patient instructed how to use IS.  Patient verbalized and demonstrated understanding,  Patient able to achieve 800 on IS.  RN tried to set up education videos for patient, no answer on video extension.  RN will have night shift RN address education videos with patient.

## 2016-11-17 ENCOUNTER — Inpatient Hospital Stay (HOSPITAL_COMMUNITY): Payer: Medicare Other

## 2016-11-17 ENCOUNTER — Inpatient Hospital Stay (HOSPITAL_COMMUNITY): Payer: Medicare Other | Admitting: Anesthesiology

## 2016-11-17 ENCOUNTER — Encounter (HOSPITAL_COMMUNITY)
Admission: EM | Disposition: A | Discharge status: Home / Self Care | Payer: Self-pay | Source: Other Acute Inpatient Hospital | Attending: Thoracic Surgery (Cardiothoracic Vascular Surgery)

## 2016-11-17 DIAGNOSIS — Z951 Presence of aortocoronary bypass graft: Secondary | ICD-10-CM

## 2016-11-17 HISTORY — PX: TEE WITHOUT CARDIOVERSION: SHX5443

## 2016-11-17 HISTORY — PX: CORONARY ARTERY BYPASS GRAFT: SHX141

## 2016-11-17 LAB — POCT I-STAT, CHEM 8
BUN: 15 mg/dL (ref 6–20)
BUN: 14 mg/dL (ref 6–20)
BUN: 11 mg/dL (ref 6–20)
BUN: 12 mg/dL (ref 6–20)
BUN: 14 mg/dL (ref 6–20)
BUN: 13 mg/dL (ref 6–20)
BUN: 14 mg/dL (ref 6–20)
CALCIUM ION: 0.98 mmol/L — AB (ref 1.15–1.40)
CALCIUM ION: 1 mmol/L — AB (ref 1.15–1.40)
CHLORIDE: 104 mmol/L (ref 101–111)
CHLORIDE: 101 mmol/L (ref 101–111)
CHLORIDE: 104 mmol/L (ref 101–111)
CHLORIDE: 105 mmol/L (ref 101–111)
CHLORIDE: 105 mmol/L (ref 101–111)
CREATININE: 1.1 mg/dL (ref 0.61–1.24)
CREATININE: 1 mg/dL (ref 0.61–1.24)
CREATININE: 0.9 mg/dL (ref 0.61–1.24)
Calcium, Ion: 1.22 mmol/L (ref 1.15–1.40)
Calcium, Ion: 1.21 mmol/L (ref 1.15–1.40)
Calcium, Ion: 0.92 mmol/L — ABNORMAL LOW (ref 1.15–1.40)
Calcium, Ion: 0.96 mmol/L — ABNORMAL LOW (ref 1.15–1.40)
Calcium, Ion: 1.25 mmol/L (ref 1.15–1.40)
Chloride: 106 mmol/L (ref 101–111)
Chloride: 105 mmol/L (ref 101–111)
Creatinine, Ser: 0.7 mg/dL (ref 0.61–1.24)
Creatinine, Ser: 0.9 mg/dL (ref 0.61–1.24)
Creatinine, Ser: 1.1 mg/dL (ref 0.61–1.24)
Creatinine, Ser: 1.2 mg/dL (ref 0.61–1.24)
GLUCOSE: 92 mg/dL (ref 65–99)
GLUCOSE: 109 mg/dL — AB (ref 65–99)
GLUCOSE: 99 mg/dL (ref 65–99)
Glucose, Bld: 101 mg/dL — ABNORMAL HIGH (ref 65–99)
Glucose, Bld: 98 mg/dL (ref 65–99)
Glucose, Bld: 115 mg/dL — ABNORMAL HIGH (ref 65–99)
Glucose, Bld: 132 mg/dL — ABNORMAL HIGH (ref 65–99)
HCT: 30 % — ABNORMAL LOW (ref 39.0–52.0)
HCT: 21 % — ABNORMAL LOW (ref 39.0–52.0)
HEMATOCRIT: 34 % — AB (ref 39.0–52.0)
HEMATOCRIT: 24 % — AB (ref 39.0–52.0)
HEMATOCRIT: 22 % — AB (ref 39.0–52.0)
HEMATOCRIT: 24 % — AB (ref 39.0–52.0)
HEMATOCRIT: 23 % — AB (ref 39.0–52.0)
HEMOGLOBIN: 10.2 g/dL — AB (ref 13.0–17.0)
HEMOGLOBIN: 7.8 g/dL — AB (ref 13.0–17.0)
Hemoglobin: 11.6 g/dL — ABNORMAL LOW (ref 13.0–17.0)
Hemoglobin: 8.2 g/dL — ABNORMAL LOW (ref 13.0–17.0)
Hemoglobin: 7.1 g/dL — ABNORMAL LOW (ref 13.0–17.0)
Hemoglobin: 7.5 g/dL — ABNORMAL LOW (ref 13.0–17.0)
Hemoglobin: 8.2 g/dL — ABNORMAL LOW (ref 13.0–17.0)
POTASSIUM: 4.6 mmol/L (ref 3.5–5.1)
POTASSIUM: 4.2 mmol/L (ref 3.5–5.1)
POTASSIUM: 4.4 mmol/L (ref 3.5–5.1)
POTASSIUM: 4.9 mmol/L (ref 3.5–5.1)
POTASSIUM: 5.3 mmol/L — AB (ref 3.5–5.1)
POTASSIUM: 4.2 mmol/L (ref 3.5–5.1)
Potassium: 4.8 mmol/L (ref 3.5–5.1)
SODIUM: 141 mmol/L (ref 135–145)
SODIUM: 141 mmol/L (ref 135–145)
Sodium: 141 mmol/L (ref 135–145)
Sodium: 140 mmol/L (ref 135–145)
Sodium: 140 mmol/L (ref 135–145)
Sodium: 141 mmol/L (ref 135–145)
Sodium: 141 mmol/L (ref 135–145)
TCO2: 28 mmol/L (ref 0–100)
TCO2: 27 mmol/L (ref 0–100)
TCO2: 28 mmol/L (ref 0–100)
TCO2: 29 mmol/L (ref 0–100)
TCO2: 30 mmol/L (ref 0–100)
TCO2: 27 mmol/L (ref 0–100)
TCO2: 25 mmol/L (ref 0–100)

## 2016-11-17 LAB — POCT I-STAT 3, ART BLOOD GAS (G3+)
ACID-BASE DEFICIT: 3 mmol/L — AB (ref 0.0–2.0)
ACID-BASE EXCESS: 3 mmol/L — AB (ref 0.0–2.0)
ACID-BASE EXCESS: 2 mmol/L (ref 0.0–2.0)
Acid-Base Excess: 2 mmol/L (ref 0.0–2.0)
Acid-base deficit: 2 mmol/L (ref 0.0–2.0)
Acid-base deficit: 2 mmol/L (ref 0.0–2.0)
BICARBONATE: 30.6 mmol/L — AB (ref 20.0–28.0)
BICARBONATE: 26.2 mmol/L (ref 20.0–28.0)
BICARBONATE: 22.8 mmol/L (ref 20.0–28.0)
BICARBONATE: 24.2 mmol/L (ref 20.0–28.0)
Bicarbonate: 26.3 mmol/L (ref 20.0–28.0)
Bicarbonate: 24.1 mmol/L (ref 20.0–28.0)
O2 SAT: 100 %
O2 SAT: 100 %
O2 SAT: 93 %
O2 Saturation: 100 %
O2 Saturation: 98 %
O2 Saturation: 97 %
PCO2 ART: 36 mmHg (ref 32.0–48.0)
PCO2 ART: 48 mmHg (ref 32.0–48.0)
PCO2 ART: 39.1 mmHg (ref 32.0–48.0)
PCO2 ART: 49.3 mmHg — AB (ref 32.0–48.0)
PH ART: 7.469 — AB (ref 7.350–7.450)
PH ART: 7.301 — AB (ref 7.350–7.450)
PO2 ART: 362 mmHg — AB (ref 83.0–108.0)
PO2 ART: 536 mmHg — AB (ref 83.0–108.0)
PO2 ART: 239 mmHg — AB (ref 83.0–108.0)
PO2 ART: 98 mmHg (ref 83.0–108.0)
Patient temperature: 36
Patient temperature: 38.5
Patient temperature: 37.5
TCO2: 32 mmol/L (ref 0–100)
TCO2: 27 mmol/L (ref 0–100)
TCO2: 28 mmol/L (ref 0–100)
TCO2: 26 mmol/L (ref 0–100)
TCO2: 24 mmol/L (ref 0–100)
TCO2: 26 mmol/L (ref 0–100)
pCO2 arterial: 60.6 mmHg — ABNORMAL HIGH (ref 32.0–48.0)
pCO2 arterial: 40.4 mmHg (ref 32.0–48.0)
pH, Arterial: 7.312 — ABNORMAL LOW (ref 7.350–7.450)
pH, Arterial: 7.422 (ref 7.350–7.450)
pH, Arterial: 7.304 — ABNORMAL LOW (ref 7.350–7.450)
pH, Arterial: 7.38 (ref 7.350–7.450)
pO2, Arterial: 72 mmHg — ABNORMAL LOW (ref 83.0–108.0)
pO2, Arterial: 107 mmHg (ref 83.0–108.0)

## 2016-11-17 LAB — TROPONIN I: TROPONIN I: 16.03 ng/mL — AB (ref <0.03)

## 2016-11-17 LAB — CBC
HCT: 38.5 % — ABNORMAL LOW (ref 39.0–52.0)
HCT: 29.9 % — ABNORMAL LOW (ref 39.0–52.0)
HCT: 26.2 % — ABNORMAL LOW (ref 39.0–52.0)
HEMOGLOBIN: 10.1 g/dL — AB (ref 13.0–17.0)
Hemoglobin: 13.1 g/dL (ref 13.0–17.0)
Hemoglobin: 8.7 g/dL — ABNORMAL LOW (ref 13.0–17.0)
MCH: 30 pg (ref 26.0–34.0)
MCH: 29.4 pg (ref 26.0–34.0)
MCH: 28.9 pg (ref 26.0–34.0)
MCHC: 34 g/dL (ref 30.0–36.0)
MCHC: 33.8 g/dL (ref 30.0–36.0)
MCHC: 33.2 g/dL (ref 30.0–36.0)
MCV: 88.3 fL (ref 78.0–100.0)
MCV: 87.2 fL (ref 78.0–100.0)
MCV: 87 fL (ref 78.0–100.0)
PLATELETS: 172 10*3/uL (ref 150–400)
PLATELETS: 165 10*3/uL (ref 150–400)
Platelets: 147 10*3/uL — ABNORMAL LOW (ref 150–400)
RBC: 4.36 MIL/uL (ref 4.22–5.81)
RBC: 3.43 MIL/uL — ABNORMAL LOW (ref 4.22–5.81)
RBC: 3.01 MIL/uL — ABNORMAL LOW (ref 4.22–5.81)
RDW: 13.2 % (ref 11.5–15.5)
RDW: 12.8 % (ref 11.5–15.5)
RDW: 12.9 % (ref 11.5–15.5)
WBC: 8.8 10*3/uL (ref 4.0–10.5)
WBC: 12 10*3/uL — ABNORMAL HIGH (ref 4.0–10.5)
WBC: 11.9 10*3/uL — AB (ref 4.0–10.5)

## 2016-11-17 LAB — GLUCOSE, CAPILLARY
GLUCOSE-CAPILLARY: 136 mg/dL — AB (ref 65–99)
GLUCOSE-CAPILLARY: 102 mg/dL — AB (ref 65–99)
Glucose-Capillary: 150 mg/dL — ABNORMAL HIGH (ref 65–99)
Glucose-Capillary: 143 mg/dL — ABNORMAL HIGH (ref 65–99)
Glucose-Capillary: 117 mg/dL — ABNORMAL HIGH (ref 65–99)
Glucose-Capillary: 109 mg/dL — ABNORMAL HIGH (ref 65–99)
Glucose-Capillary: 111 mg/dL — ABNORMAL HIGH (ref 65–99)
Glucose-Capillary: 121 mg/dL — ABNORMAL HIGH (ref 65–99)
Glucose-Capillary: 102 mg/dL — ABNORMAL HIGH (ref 65–99)

## 2016-11-17 LAB — BLOOD GAS, ARTERIAL
Acid-Base Excess: 0.8 mmol/L (ref 0.0–2.0)
Bicarbonate: 25 mmol/L (ref 20.0–28.0)
DRAWN BY: 398661
FIO2: 21
O2 Saturation: 94.5 %
PATIENT TEMPERATURE: 98.6
PO2 ART: 71.9 mmHg — AB (ref 83.0–108.0)
pCO2 arterial: 40.3 mmHg (ref 32.0–48.0)
pH, Arterial: 7.409 (ref 7.350–7.450)

## 2016-11-17 LAB — HEMOGLOBIN AND HEMATOCRIT, BLOOD
HEMATOCRIT: 23.9 % — AB (ref 39.0–52.0)
HEMOGLOBIN: 8.1 g/dL — AB (ref 13.0–17.0)

## 2016-11-17 LAB — BASIC METABOLIC PANEL
Anion gap: 9 (ref 5–15)
BUN: 12 mg/dL (ref 6–20)
CHLORIDE: 107 mmol/L (ref 101–111)
CO2: 22 mmol/L (ref 22–32)
CREATININE: 1.18 mg/dL (ref 0.61–1.24)
Calcium: 8.8 mg/dL — ABNORMAL LOW (ref 8.9–10.3)
GFR calc Af Amer: >60 mL/min (ref >60)
GFR calc non Af Amer: >60 mL/min (ref >60)
GLUCOSE: 99 mg/dL (ref 65–99)
POTASSIUM: 4.2 mmol/L (ref 3.5–5.1)
SODIUM: 138 mmol/L (ref 135–145)

## 2016-11-17 LAB — LIPID PANEL
CHOL/HDL RATIO: 6.1 ratio
CHOLESTEROL: 189 mg/dL (ref 0–200)
HDL: 31 mg/dL — ABNORMAL LOW (ref >40)
LDL CALC: 135 mg/dL — AB (ref 0–99)
Triglycerides: 115 mg/dL (ref <150)
VLDL: 23 mg/dL (ref 0–40)

## 2016-11-17 LAB — PROTIME-INR
INR: 1.31
PROTHROMBIN TIME: 16.4 s — AB (ref 11.4–15.2)

## 2016-11-17 LAB — POCT I-STAT 4, (NA,K, GLUC, HGB,HCT)
Glucose, Bld: 131 mg/dL — ABNORMAL HIGH (ref 65–99)
HEMATOCRIT: 29 % — AB (ref 39.0–52.0)
HEMOGLOBIN: 9.9 g/dL — AB (ref 13.0–17.0)
POTASSIUM: 4.2 mmol/L (ref 3.5–5.1)
SODIUM: 141 mmol/L (ref 135–145)

## 2016-11-17 LAB — HEMOGLOBIN A1C
HEMOGLOBIN A1C: 5.6 % (ref 4.8–5.6)
Hgb A1c MFr Bld: 5.5 % (ref 4.8–5.6)
Mean Plasma Glucose: 111 mg/dL
Mean Plasma Glucose: 114 mg/dL

## 2016-11-17 LAB — CREATININE, SERUM
CREATININE: 1.25 mg/dL — AB (ref 0.61–1.24)
GFR calc Af Amer: >60 mL/min (ref >60)
GFR, EST NON AFRICAN AMERICAN: 58 mL/min — AB (ref >60)

## 2016-11-17 LAB — PLATELET COUNT: Platelets: 122 10*3/uL — ABNORMAL LOW (ref 150–400)

## 2016-11-17 LAB — HEPARIN LEVEL (UNFRACTIONATED): Heparin Unfractionated: 0.2 IU/mL — ABNORMAL LOW (ref 0.30–0.70)

## 2016-11-17 LAB — MAGNESIUM: Magnesium: 2.6 mg/dL — ABNORMAL HIGH (ref 1.7–2.4)

## 2016-11-17 LAB — APTT: aPTT: 32 seconds (ref 24–36)

## 2016-11-17 SURGERY — CORONARY ARTERY BYPASS GRAFTING (CABG)
Anesthesia: General | Site: Chest

## 2016-11-17 MED ORDER — SODIUM CHLORIDE 0.9 % IV SOLN: 250.0000 mL | INTRAVENOUS | Status: DC

## 2016-11-17 MED ORDER — CHLORHEXIDINE GLUCONATE 0.12% ORAL RINSE (MEDLINE KIT)
15.0000 mL | Freq: Two times a day (BID) | OROMUCOSAL | Status: DC
  Administered 2016-11-17: 15 mL via OROMUCOSAL

## 2016-11-17 MED ORDER — ARTIFICIAL TEARS OP OINT
TOPICAL_OINTMENT | OPHTHALMIC | Status: DC | PRN
  Administered 2016-11-17: 1 via OPHTHALMIC

## 2016-11-17 MED ORDER — PROMETHAZINE HCL 25 MG/ML IJ SOLN: 6.2500 mg | INTRAMUSCULAR | Status: DC | PRN

## 2016-11-17 MED ORDER — LIDOCAINE HCL (CARDIAC) 20 MG/ML IV SOLN
INTRAVENOUS | Status: DC | PRN
  Administered 2016-11-17: 60 mg via INTRAVENOUS

## 2016-11-17 MED ORDER — ASPIRIN EC 325 MG PO TBEC
325.0000 mg | DELAYED_RELEASE_TABLET | Freq: Every day | ORAL | Status: DC
  Administered 2016-11-18 – 2016-11-20 (×3): 325 mg via ORAL
  Filled 2016-11-17 (×3): qty 1

## 2016-11-17 MED ORDER — HEPARIN SODIUM (PORCINE) 1000 UNIT/ML IJ SOLN
INTRAMUSCULAR | Status: AC
  Filled 2016-11-17: qty 1

## 2016-11-17 MED ORDER — INSULIN REGULAR BOLUS VIA INFUSION
0.0000 [IU] | Freq: Three times a day (TID) | INTRAVENOUS | Status: DC
Start: 2016-11-17 — End: 2016-11-18
  Filled 2016-11-17: qty 10

## 2016-11-17 MED ORDER — SODIUM CHLORIDE 0.9 % IJ SOLN
OROMUCOSAL | Status: DC | PRN
  Administered 2016-11-17 (×3): via TOPICAL

## 2016-11-17 MED ORDER — FENTANYL CITRATE (PF) 250 MCG/5ML IJ SOLN
INTRAMUSCULAR | Status: AC
  Filled 2016-11-17: qty 30

## 2016-11-17 MED ORDER — DEXTROSE 5 % IV SOLN
1.5000 g | Freq: Two times a day (BID) | INTRAVENOUS | Status: AC
  Administered 2016-11-17 – 2016-11-19 (×4): 1.5 g via INTRAVENOUS
  Filled 2016-11-17 (×4): qty 1.5

## 2016-11-17 MED ORDER — 0.9 % SODIUM CHLORIDE (POUR BTL) OPTIME
TOPICAL | Status: DC | PRN
  Administered 2016-11-17: 6000 mL

## 2016-11-17 MED ORDER — SODIUM CHLORIDE 0.9 % IV SOLN
30.0000 meq | Freq: Once | INTRAVENOUS | Status: DC
  Filled 2016-11-17: qty 15

## 2016-11-17 MED ORDER — LIDOCAINE 2% (20 MG/ML) 5 ML SYRINGE
INTRAMUSCULAR | Status: AC
  Filled 2016-11-17: qty 5

## 2016-11-17 MED ORDER — DEXMEDETOMIDINE HCL IN NACL 200 MCG/50ML IV SOLN
0.0000 ug/kg/h | INTRAVENOUS | Status: DC
  Administered 2016-11-17: 0.7 ug/kg/h via INTRAVENOUS
  Administered 2016-11-17: 0.5 ug/kg/h via INTRAVENOUS
  Administered 2016-11-17: 0.2 ug/kg/h via INTRAVENOUS
  Filled 2016-11-17 (×2): qty 50

## 2016-11-17 MED ORDER — PROPOFOL 10 MG/ML IV BOLUS
INTRAVENOUS | Status: DC | PRN
Start: 2016-11-17 — End: 2016-11-17
  Administered 2016-11-17: 50 mg via INTRAVENOUS

## 2016-11-17 MED ORDER — PROPOFOL 10 MG/ML IV BOLUS
INTRAVENOUS | Status: AC
  Filled 2016-11-17: qty 20

## 2016-11-17 MED ORDER — PROTAMINE SULFATE 10 MG/ML IV SOLN
INTRAVENOUS | Status: DC | PRN
  Administered 2016-11-17: 260 mg via INTRAVENOUS

## 2016-11-17 MED ORDER — FAMOTIDINE IN NACL 20-0.9 MG/50ML-% IV SOLN
20.0000 mg | Freq: Two times a day (BID) | INTRAVENOUS | Status: AC
  Administered 2016-11-17 – 2016-11-18 (×2): 20 mg via INTRAVENOUS
  Filled 2016-11-17: qty 50

## 2016-11-17 MED ORDER — SODIUM CHLORIDE 0.9 % IV SOLN
INTRAVENOUS | Status: DC
  Administered 2016-11-18: 10 mL/h via INTRAVENOUS
  Administered 2016-11-19: 04:00:00 via INTRAVENOUS

## 2016-11-17 MED ORDER — PANTOPRAZOLE SODIUM 40 MG PO TBEC
40.0000 mg | DELAYED_RELEASE_TABLET | Freq: Every day | ORAL | Status: DC
  Administered 2016-11-19 – 2016-11-24 (×6): 40 mg via ORAL
  Filled 2016-11-17 (×7): qty 1

## 2016-11-17 MED ORDER — CALCIUM CHLORIDE 10 % IV SOLN
1.0000 g | Freq: Once | INTRAVENOUS | Status: AC
  Administered 2016-11-17: 1 g via INTRAVENOUS

## 2016-11-17 MED ORDER — ACETAMINOPHEN 160 MG/5ML PO SOLN: 650.0000 mg | Freq: Once | ORAL | Status: AC

## 2016-11-17 MED ORDER — LACTATED RINGERS IV SOLN: INTRAVENOUS | Status: DC

## 2016-11-17 MED ORDER — MAGNESIUM SULFATE 4 GM/100ML IV SOLN
4.0000 g | Freq: Once | INTRAVENOUS | Status: AC
  Administered 2016-11-17: 4 g via INTRAVENOUS
  Filled 2016-11-17: qty 100

## 2016-11-17 MED ORDER — MIDAZOLAM HCL 10 MG/2ML IJ SOLN
INTRAMUSCULAR | Status: AC
  Filled 2016-11-17: qty 2

## 2016-11-17 MED ORDER — SODIUM CHLORIDE 0.45 % IV SOLN: INTRAVENOUS | Status: DC | PRN

## 2016-11-17 MED ORDER — SUCCINYLCHOLINE CHLORIDE 200 MG/10ML IV SOSY
PREFILLED_SYRINGE | INTRAVENOUS | Status: AC
  Filled 2016-11-17: qty 10

## 2016-11-17 MED ORDER — SODIUM BICARBONATE 8.4 % IV SOLN
50.0000 meq | Freq: Once | INTRAVENOUS | Status: AC
  Administered 2016-11-17: 50 meq via INTRAVENOUS

## 2016-11-17 MED ORDER — VECURONIUM BROMIDE 10 MG IV SOLR
INTRAVENOUS | Status: AC
  Filled 2016-11-17: qty 10

## 2016-11-17 MED ORDER — DOCUSATE SODIUM 100 MG PO CAPS
200.0000 mg | ORAL_CAPSULE | Freq: Every day | ORAL | Status: DC
  Administered 2016-11-18 – 2016-11-23 (×4): 200 mg via ORAL
  Filled 2016-11-17 (×6): qty 2

## 2016-11-17 MED ORDER — HEPARIN SODIUM (PORCINE) 1000 UNIT/ML IJ SOLN
INTRAMUSCULAR | Status: DC | PRN
  Administered 2016-11-17: 30000 [IU] via INTRAVENOUS
  Administered 2016-11-17: 2000 [IU] via INTRAVENOUS

## 2016-11-17 MED ORDER — NITROGLYCERIN IN D5W 200-5 MCG/ML-% IV SOLN: 0.0000 ug/min | INTRAVENOUS | Status: DC

## 2016-11-17 MED ORDER — GLYCOPYRROLATE 0.2 MG/ML IJ SOLN
INTRAMUSCULAR | Status: DC | PRN
  Administered 2016-11-17: .2 mg via INTRAVENOUS
  Administered 2016-11-17: 0.2 mg via INTRAVENOUS

## 2016-11-17 MED ORDER — VECURONIUM BROMIDE 10 MG IV SOLR
INTRAVENOUS | Status: DC | PRN
  Administered 2016-11-17 (×5): 5 mg via INTRAVENOUS

## 2016-11-17 MED ORDER — DEXTROSE 5 % IV SOLN
INTRAVENOUS | Status: DC | PRN
  Administered 2016-11-17: 08:00:00 via INTRAVENOUS

## 2016-11-17 MED ORDER — EPHEDRINE 5 MG/ML INJ
INTRAVENOUS | Status: AC
  Filled 2016-11-17: qty 10

## 2016-11-17 MED ORDER — LACTATED RINGERS IV SOLN
INTRAVENOUS | Status: DC | PRN
  Administered 2016-11-17 (×2): via INTRAVENOUS

## 2016-11-17 MED ORDER — ROCURONIUM BROMIDE 100 MG/10ML IV SOLN
INTRAVENOUS | Status: DC | PRN
  Administered 2016-11-17: 50 mg via INTRAVENOUS

## 2016-11-17 MED ORDER — ROCURONIUM BROMIDE 50 MG/5ML IV SOSY
PREFILLED_SYRINGE | INTRAVENOUS | Status: AC
  Filled 2016-11-17: qty 5

## 2016-11-17 MED ORDER — ORAL CARE MOUTH RINSE
15.0000 mL | Freq: Four times a day (QID) | OROMUCOSAL | Status: DC
  Administered 2016-11-17: 15 mL via OROMUCOSAL

## 2016-11-17 MED ORDER — MORPHINE SULFATE (PF) 2 MG/ML IV SOLN
2.0000 mg | INTRAVENOUS | Status: DC | PRN
  Administered 2016-11-18 (×3): 2 mg via INTRAVENOUS
  Filled 2016-11-17 (×4): qty 1

## 2016-11-17 MED ORDER — SODIUM CHLORIDE 0.9 % IV SOLN
0.0000 ug/min | INTRAVENOUS | Status: DC
  Filled 2016-11-17 (×3): qty 2

## 2016-11-17 MED ORDER — SUCCINYLCHOLINE CHLORIDE 20 MG/ML IJ SOLN
INTRAMUSCULAR | Status: DC | PRN
  Administered 2016-11-17: 100 mg via INTRAVENOUS

## 2016-11-17 MED ORDER — METOPROLOL TARTRATE 5 MG/5ML IV SOLN
2.5000 mg | INTRAVENOUS | Status: DC | PRN
  Administered 2016-11-19 – 2016-11-20 (×2): 5 mg via INTRAVENOUS
  Filled 2016-11-17 (×3): qty 5

## 2016-11-17 MED ORDER — HEMOSTATIC AGENTS (NO CHARGE) OPTIME
TOPICAL | Status: DC | PRN
  Administered 2016-11-17: 1 via TOPICAL

## 2016-11-17 MED ORDER — ALBUMIN HUMAN 5 % IV SOLN
INTRAVENOUS | Status: DC | PRN
  Administered 2016-11-17: 13:00:00 via INTRAVENOUS

## 2016-11-17 MED ORDER — ACETAMINOPHEN 160 MG/5ML PO SOLN: 1000.0000 mg | Freq: Four times a day (QID) | ORAL | Status: DC

## 2016-11-17 MED ORDER — MIDAZOLAM HCL 2 MG/2ML IJ SOLN: 2.0000 mg | INTRAMUSCULAR | Status: DC | PRN

## 2016-11-17 MED ORDER — MORPHINE SULFATE (PF) 2 MG/ML IV SOLN: 1.0000 mg | INTRAVENOUS | Status: AC | PRN

## 2016-11-17 MED ORDER — ASPIRIN 81 MG PO CHEW: 324.0000 mg | CHEWABLE_TABLET | Freq: Every day | ORAL | Status: DC

## 2016-11-17 MED ORDER — PHENYLEPHRINE 40 MCG/ML (10ML) SYRINGE FOR IV PUSH (FOR BLOOD PRESSURE SUPPORT)
PREFILLED_SYRINGE | INTRAVENOUS | Status: AC
  Filled 2016-11-17: qty 10

## 2016-11-17 MED ORDER — EPHEDRINE SULFATE 50 MG/ML IJ SOLN
INTRAMUSCULAR | Status: DC | PRN
  Administered 2016-11-17: 10 mg via INTRAVENOUS

## 2016-11-17 MED ORDER — PROTAMINE SULFATE 10 MG/ML IV SOLN
INTRAVENOUS | Status: AC
  Filled 2016-11-17: qty 25

## 2016-11-17 MED ORDER — DOPAMINE-DEXTROSE 1.6-5 MG/ML-% IV SOLN
INTRAVENOUS | Status: DC | PRN
  Administered 2016-11-17: 5 ug/kg/min via INTRAVENOUS

## 2016-11-17 MED ORDER — CHLORHEXIDINE GLUCONATE 0.12 % MT SOLN
15.0000 mL | OROMUCOSAL | Status: AC
  Administered 2016-11-17: 15 mL via OROMUCOSAL

## 2016-11-17 MED ORDER — OXYCODONE HCL 5 MG PO TABS
5.0000 mg | ORAL_TABLET | ORAL | Status: DC | PRN
  Administered 2016-11-18 – 2016-11-19 (×4): 10 mg via ORAL
  Administered 2016-11-20 (×2): 5 mg via ORAL
  Filled 2016-11-17 (×2): qty 2
  Filled 2016-11-17 (×2): qty 1
  Filled 2016-11-17 (×2): qty 2

## 2016-11-17 MED ORDER — SODIUM CHLORIDE 0.9% FLUSH
3.0000 mL | Freq: Two times a day (BID) | INTRAVENOUS | Status: DC
  Administered 2016-11-18: 10 mL via INTRAVENOUS
  Administered 2016-11-18 – 2016-11-19 (×2): 3 mL via INTRAVENOUS
  Administered 2016-11-19: 10 mL via INTRAVENOUS
  Administered 2016-11-20: 3 mL via INTRAVENOUS

## 2016-11-17 MED ORDER — BISACODYL 10 MG RE SUPP: 10.0000 mg | Freq: Every day | RECTAL | Status: DC

## 2016-11-17 MED ORDER — TRAMADOL HCL 50 MG PO TABS
50.0000 mg | ORAL_TABLET | ORAL | Status: DC | PRN
  Administered 2016-11-21: 100 mg via ORAL
  Administered 2016-11-21: 50 mg via ORAL
  Administered 2016-11-22: 100 mg via ORAL
  Filled 2016-11-17: qty 2
  Filled 2016-11-17: qty 1
  Filled 2016-11-17: qty 2

## 2016-11-17 MED ORDER — ONDANSETRON HCL 4 MG/2ML IJ SOLN
4.0000 mg | Freq: Four times a day (QID) | INTRAMUSCULAR | Status: DC | PRN
  Administered 2016-11-19: 4 mg via INTRAVENOUS
  Filled 2016-11-17: qty 2

## 2016-11-17 MED ORDER — DEXMEDETOMIDINE HCL IN NACL 200 MCG/50ML IV SOLN
INTRAVENOUS | Status: AC
  Filled 2016-11-17: qty 50

## 2016-11-17 MED ORDER — METOPROLOL TARTRATE 25 MG/10 ML ORAL SUSPENSION: 12.5000 mg | Freq: Two times a day (BID) | ORAL | Status: DC

## 2016-11-17 MED ORDER — ACETAMINOPHEN 650 MG RE SUPP
650.0000 mg | Freq: Once | RECTAL | Status: AC
  Administered 2016-11-17: 650 mg via RECTAL

## 2016-11-17 MED ORDER — MIDAZOLAM HCL 5 MG/5ML IJ SOLN
INTRAMUSCULAR | Status: DC | PRN
  Administered 2016-11-17 (×5): 2 mg via INTRAVENOUS

## 2016-11-17 MED ORDER — ACETAMINOPHEN 500 MG PO TABS
1000.0000 mg | ORAL_TABLET | Freq: Four times a day (QID) | ORAL | Status: AC
  Administered 2016-11-17 – 2016-11-22 (×16): 1000 mg via ORAL
  Filled 2016-11-17 (×15): qty 2

## 2016-11-17 MED ORDER — PROTAMINE SULFATE 10 MG/ML IV SOLN
INTRAVENOUS | Status: AC
  Filled 2016-11-17: qty 10

## 2016-11-17 MED ORDER — BISACODYL 5 MG PO TBEC
10.0000 mg | DELAYED_RELEASE_TABLET | Freq: Every day | ORAL | Status: DC
  Administered 2016-11-18 – 2016-11-23 (×4): 10 mg via ORAL
  Filled 2016-11-17 (×5): qty 2

## 2016-11-17 MED ORDER — SODIUM CHLORIDE 0.9% FLUSH: 3.0000 mL | INTRAVENOUS | Status: DC | PRN

## 2016-11-17 MED ORDER — ARTIFICIAL TEARS OP OINT
TOPICAL_OINTMENT | OPHTHALMIC | Status: AC
  Filled 2016-11-17: qty 3.5

## 2016-11-17 MED ORDER — LACTATED RINGERS IV SOLN
INTRAVENOUS | Status: DC
  Administered 2016-11-17: 500 mL via INTRAVENOUS

## 2016-11-17 MED ORDER — HYDROMORPHONE HCL 1 MG/ML IJ SOLN: 0.2500 mg | INTRAMUSCULAR | Status: DC | PRN

## 2016-11-17 MED ORDER — SODIUM CHLORIDE 0.9 % IV SOLN
INTRAVENOUS | Status: DC | PRN
  Administered 2016-11-17: 08:00:00 via INTRAVENOUS

## 2016-11-17 MED ORDER — FENTANYL CITRATE (PF) 250 MCG/5ML IJ SOLN
INTRAMUSCULAR | Status: DC | PRN
  Administered 2016-11-17: 500 ug via INTRAVENOUS
  Administered 2016-11-17: 150 ug via INTRAVENOUS
  Administered 2016-11-17 (×2): 100 ug via INTRAVENOUS
  Administered 2016-11-17: 250 ug via INTRAVENOUS
  Administered 2016-11-17: 150 ug via INTRAVENOUS
  Administered 2016-11-17: 50 ug via INTRAVENOUS
  Administered 2016-11-17: 150 ug via INTRAVENOUS
  Administered 2016-11-17: 50 ug via INTRAVENOUS

## 2016-11-17 MED ORDER — VANCOMYCIN HCL IN DEXTROSE 1-5 GM/200ML-% IV SOLN
1000.0000 mg | Freq: Once | INTRAVENOUS | Status: AC
  Administered 2016-11-17: 1000 mg via INTRAVENOUS
  Filled 2016-11-17: qty 200

## 2016-11-17 MED ORDER — METOPROLOL TARTRATE 12.5 MG HALF TABLET: 12.5000 mg | ORAL_TABLET | Freq: Two times a day (BID) | ORAL | Status: DC

## 2016-11-17 MED ORDER — SODIUM CHLORIDE 0.9 % IV SOLN
INTRAVENOUS | Status: DC
  Filled 2016-11-17 (×2): qty 2.5

## 2016-11-17 MED ORDER — ALBUMIN HUMAN 5 % IV SOLN
250.0000 mL | INTRAVENOUS | Status: AC | PRN
  Administered 2016-11-17 (×4): 250 mL via INTRAVENOUS
  Filled 2016-11-17 (×2): qty 250

## 2016-11-17 MED ORDER — LACTATED RINGERS IV SOLN: 500.0000 mL | Freq: Once | INTRAVENOUS | Status: DC | PRN

## 2016-11-17 MED FILL — Heparin Sodium (Porcine) Inj 1000 Unit/ML: INTRAMUSCULAR | Qty: 30 | Status: AC

## 2016-11-17 MED FILL — Potassium Chloride Inj 2 mEq/ML: INTRAVENOUS | Qty: 40 | Status: AC

## 2016-11-17 MED FILL — Nitroglycerin IV Soln 100 MCG/ML in D5W: INTRA_ARTERIAL | Qty: 10 | Status: AC

## 2016-11-17 MED FILL — Magnesium Sulfate Inj 50%: INTRAMUSCULAR | Qty: 10 | Status: AC

## 2016-11-17 SURGICAL SUPPLY — 93 items
BAG DECANTER FOR FLEXI CONT (MISCELLANEOUS) ×3 IMPLANT
BANDAGE ACE 4X5 VEL STRL LF (GAUZE/BANDAGES/DRESSINGS) ×6 IMPLANT
BANDAGE ACE 6X5 VEL STRL LF (GAUZE/BANDAGES/DRESSINGS) ×6 IMPLANT
BASKET HEART (ORDER IN 25'S) (MISCELLANEOUS) ×1
BASKET HEART (ORDER IN 25S) (MISCELLANEOUS) ×2 IMPLANT
BLADE STERNUM SYSTEM 6 (BLADE) ×3 IMPLANT
BNDG GAUZE ELAST 4 BULKY (GAUZE/BANDAGES/DRESSINGS) ×6 IMPLANT
CANISTER SUCTION 2500CC (MISCELLANEOUS) ×3 IMPLANT
CANNULA EZ GLIDE AORTIC 21FR (CANNULA) ×3 IMPLANT
CANNULA VESSEL 3MM BLUNT TIP (CANNULA) ×3 IMPLANT
CATH CPB KIT HENDRICKSON (MISCELLANEOUS) ×3 IMPLANT
CATH ROBINSON RED A/P 18FR (CATHETERS) ×3 IMPLANT
CATH THORACIC 36FR (CATHETERS) ×3 IMPLANT
CATH THORACIC 36FR RT ANG (CATHETERS) ×6 IMPLANT
CLIP FOGARTY SPRING 6M (CLIP) ×3 IMPLANT
CLIP TI MEDIUM 24 (CLIP) IMPLANT
CLIP TI WIDE RED SMALL 24 (CLIP) ×15 IMPLANT
CRADLE DONUT ADULT HEAD (MISCELLANEOUS) ×3 IMPLANT
DRAPE CARDIOVASCULAR INCISE (DRAPES) ×1
DRAPE SLUSH/WARMER DISC (DRAPES) ×3 IMPLANT
DRAPE SRG 135X102X78XABS (DRAPES) ×2 IMPLANT
DRSG COVADERM 4X14 (GAUZE/BANDAGES/DRESSINGS) ×3 IMPLANT
ELECT REM PT RETURN 9FT ADLT (ELECTROSURGICAL) ×6
ELECTRODE REM PT RTRN 9FT ADLT (ELECTROSURGICAL) ×4 IMPLANT
FELT TEFLON 1X6 (MISCELLANEOUS) ×3 IMPLANT
GAUZE SPONGE 4X4 12PLY STRL (GAUZE/BANDAGES/DRESSINGS) ×9 IMPLANT
GLOVE BIOGEL M 6.5 STRL (GLOVE) ×12 IMPLANT
GLOVE BIOGEL M 7.0 STRL (GLOVE) ×12 IMPLANT
GLOVE BIOGEL M STRL SZ7.5 (GLOVE) ×6 IMPLANT
GLOVE BIOGEL PI IND STRL 6.5 (GLOVE) ×2 IMPLANT
GLOVE BIOGEL PI IND STRL 7.0 (GLOVE) ×10 IMPLANT
GLOVE BIOGEL PI INDICATOR 6.5 (GLOVE) ×1
GLOVE BIOGEL PI INDICATOR 7.0 (GLOVE) ×5
GLOVE SURG SIGNA 7.5 PF LTX (GLOVE) ×15 IMPLANT
GOWN STRL REUS W/ TWL LRG LVL3 (GOWN DISPOSABLE) ×20 IMPLANT
GOWN STRL REUS W/ TWL XL LVL3 (GOWN DISPOSABLE) ×6 IMPLANT
GOWN STRL REUS W/TWL LRG LVL3 (GOWN DISPOSABLE) ×10
GOWN STRL REUS W/TWL XL LVL3 (GOWN DISPOSABLE) ×3
HEMOSTAT POWDER SURGIFOAM 1G (HEMOSTASIS) ×9 IMPLANT
HEMOSTAT SURGICEL 2X14 (HEMOSTASIS) ×3 IMPLANT
INSERT FOGARTY XLG (MISCELLANEOUS) IMPLANT
KIT BASIN OR (CUSTOM PROCEDURE TRAY) ×3 IMPLANT
KIT ROOM TURNOVER OR (KITS) ×3 IMPLANT
KIT SUCTION CATH 14FR (SUCTIONS) ×6 IMPLANT
KIT VASOVIEW ACCESSORY VH 2004 (KITS) ×3 IMPLANT
KIT VASOVIEW HEMOPRO VH 3000 (KITS) ×3 IMPLANT
MARKER GRAFT CORONARY BYPASS (MISCELLANEOUS) ×9 IMPLANT
NS IRRIG 1000ML POUR BTL (IV SOLUTION) ×18 IMPLANT
PACK OPEN HEART (CUSTOM PROCEDURE TRAY) ×3 IMPLANT
PAD ARMBOARD 7.5X6 YLW CONV (MISCELLANEOUS) ×6 IMPLANT
PAD ELECT DEFIB RADIOL ZOLL (MISCELLANEOUS) ×3 IMPLANT
PENCIL BUTTON HOLSTER BLD 10FT (ELECTRODE) ×3 IMPLANT
PUNCH AORTIC ROTATE  4.5MM 8IN (MISCELLANEOUS) ×3 IMPLANT
PUNCH AORTIC ROTATE 4.0MM (MISCELLANEOUS) IMPLANT
PUNCH AORTIC ROTATE 4.5MM 8IN (MISCELLANEOUS) IMPLANT
PUNCH AORTIC ROTATE 5MM 8IN (MISCELLANEOUS) IMPLANT
SET CARDIOPLEGIA MPS 5001102 (MISCELLANEOUS) ×3 IMPLANT
SPONGE LAP 18X18 X RAY DECT (DISPOSABLE) ×3 IMPLANT
SPONGE LAP 4X18 X RAY DECT (DISPOSABLE) ×3 IMPLANT
SUT BONE WAX W31G (SUTURE) ×3 IMPLANT
SUT MNCRL AB 4-0 PS2 18 (SUTURE) IMPLANT
SUT PROLENE 3 0 SH DA (SUTURE) ×3 IMPLANT
SUT PROLENE 4 0 RB 1 (SUTURE) ×1
SUT PROLENE 4 0 SH DA (SUTURE) IMPLANT
SUT PROLENE 4-0 RB1 .5 CRCL 36 (SUTURE) ×2 IMPLANT
SUT PROLENE 6 0 C 1 30 (SUTURE) ×9 IMPLANT
SUT PROLENE 7 0 BV1 MDA (SUTURE) ×6 IMPLANT
SUT PROLENE 8 0 BV175 6 (SUTURE) ×15 IMPLANT
SUT SILK  1 MH (SUTURE) ×1
SUT SILK 1 MH (SUTURE) ×2 IMPLANT
SUT STEEL 6MS V (SUTURE) ×3 IMPLANT
SUT STEEL STERNAL CCS#1 18IN (SUTURE) IMPLANT
SUT STEEL SZ 6 DBL 3X14 BALL (SUTURE) ×3 IMPLANT
SUT VIC AB 1 CTX 36 (SUTURE) ×2
SUT VIC AB 1 CTX36XBRD ANBCTR (SUTURE) ×4 IMPLANT
SUT VIC AB 2-0 CT1 27 (SUTURE) ×1
SUT VIC AB 2-0 CT1 TAPERPNT 27 (SUTURE) ×2 IMPLANT
SUT VIC AB 2-0 CTX 27 (SUTURE) IMPLANT
SUT VIC AB 3-0 SH 27 (SUTURE)
SUT VIC AB 3-0 SH 27X BRD (SUTURE) IMPLANT
SUT VIC AB 3-0 X1 27 (SUTURE) ×3 IMPLANT
SUT VICRYL 4-0 PS2 18IN ABS (SUTURE) IMPLANT
SUTURE E-PAK OPEN HEART (SUTURE) ×3 IMPLANT
SYSTEM SAHARA CHEST DRAIN ATS (WOUND CARE) ×3 IMPLANT
TAPE CLOTH SURG 4X10 WHT LF (GAUZE/BANDAGES/DRESSINGS) ×3 IMPLANT
TAPE PAPER 2X10 WHT MICROPORE (GAUZE/BANDAGES/DRESSINGS) ×3 IMPLANT
TOWEL OR 17X24 6PK STRL BLUE (TOWEL DISPOSABLE) ×3 IMPLANT
TOWEL OR 17X26 10 PK STRL BLUE (TOWEL DISPOSABLE) ×6 IMPLANT
TRAY FOLEY IC TEMP SENS 16FR (CATHETERS) ×3 IMPLANT
TUBE FEEDING 8FR 16IN STR KANG (MISCELLANEOUS) ×3 IMPLANT
TUBING INSUFFLATION (TUBING) ×3 IMPLANT
UNDERPAD 30X30 (UNDERPADS AND DIAPERS) ×3 IMPLANT
WATER STERILE IRR 1000ML POUR (IV SOLUTION) ×6 IMPLANT

## 2016-11-17 NOTE — Op Note (Signed)
NAMElenore Paddy:  Shatz, JAYR                  ACCOUNT NO.:  [id number]  MEDICAL RECORD NO.:  [id number]  LOCATION:                                 FACILITY:  PHYSICIAN:  Salvatore DecentSteven C. Dorris FetchHendrickson, M.D. DATE OF BIRTH:  DATE OF PROCEDURE:  11/17/2016 DATE OF DISCHARGE:                              OPERATIVE REPORT   PREOPERATIVE DIAGNOSIS:  Three-vessel coronary artery disease, status post ST-elevation myocardial infarction.  POSTOPERATIVE DIAGNOSIS:  Three-vessel coronary artery disease, status post ST-elevation myocardial infarction.  PROCEDURE:   Median sternotomy, extracorporeal circulation, Coronary artery bypass grafting x 4  Left internal mammary artery to LAD,  Right internal mammary artery to posterior descending,  Saphenous vein graft to obtuse marginal,  Saphenous vein graft to second diagonal Endoscopic vein harvest, both thighs.  SURGEON:  Salvatore DecentSteven C. Dorris FetchHendrickson, MD.  ASSISTANT:  Rowe ClackWayne E. Gold, PA-C.  ANESTHESIA:  General.  FINDINGS:  Transesophageal echocardiography showed some inferior apical hypokinesis, but overall preserved left ventricular function.  No significant valvular pathology.  Good quality target vessels.  Good quality mammary arteries.  Saphenous vein to OM- good quality. Saphenous vein to diagonal- fair quality.  Overall- vein poor quality.  CLINICAL NOTE:  Mr. Melvyn NethLewis is a 66 year old gentleman who presented with acute chest pain.  He had an ST-elevation MI.  [facility] and his pain resolved by the time he arrived at [facility].  He went directly to the catheterization laboratory where he was found to have three-vessel coronary disease.  He was no longer having chest pain.  He was advised to undergo coronary artery bypass grafting for survival benefit and relief of symptoms.  The indications, risks, benefits, and alternatives were discussed in detail with the patient.  He understood and accepted the risks and agreed to  proceed.  DESCRIPTION OF PROCEDURE:  Mr. Melvyn NethLewis was brought to the preoperative holding area on November 17, 2016.  Anesthesia placed a Swan-Ganz catheter and an arterial blood pressure monitoring line.  He was taken to the operating room, anesthetized, and intubated.  Intravenous antibiotics were administered.  A Foley catheter was placed. Transesophageal echocardiography was performed by Dr. Okey Dupreose.  Findings as previously noted.  The chest, abdomen, and legs were prepped and draped in usual sterile fashion.    A median sternotomy was performed and the left internal mammary artery was harvested using standard technique. Simultaneously, an incision was made in the medial aspect of the right leg just below the knee.  The vein was identified and initial attempts were made to harvest the vein endoscopically.  However, the vein was very small and appeared to be a bifurcated system.  Therefore, the decision was made to go to the left leg.  An incision was made there just below the knee.  The greater saphenous vein was identified.  It appeared larger.  It was harvested endoscopically.  2000 units of heparin was administered during the vessel harvest.  Once the vein from the left thigh had been removed, it was clear that there was only a very short segment of this vein that was usable and even that was only fair quality.  Therefore, the right  greater saphenous vein was harvested endoscopically. There was a segment long enough for grafting the obtuse marginal found in the upper right thigh.  The remainder of that vein was also poor quality.  There was a segment from the left thigh long enough to graft the diagonal that was of fair quality. Because of the limited vein conduit, the right internal mammary artery was harvested using standard technique.  This was a good quality vessel and had excellent length.  The pericardium was opened.  The remainder of the full heparin dose was given. After  confirming adequate anticoagulation with ACT measurement, the aorta was cannulated via concentric 2-0 Ethibond pledgeted pursestring sutures.  A dual stage venous cannula was placed via a pursestring suture in the right atrial appendage.  Cardiopulmonary bypass was initiated.  Flows were maintained per protocol.  The patient was cooled to 32 degrees Celsius.  The coronary arteries were inspected and anastomotic sites were chosen.  The conduits were inspected and prepared.  A foam pad was placed in the pericardium to insulate the heart.  A temperature probe was placed in myocardial septum and a cardioplegia cannula was placed in the ascending aorta.  The aorta was crossclamped.  The left ventricle was emptied via the aortic root vent.  Cardiac arrest then was achieved with a combination of cold antegrade blood cardioplegia and topical iced saline.  1.5 L of cardioplegia was administered.  There was a rapid diastolic arrest and, ultimately, septal cooling to 10 degrees Celsius.  A reversed saphenous vein graft was placed end-to-side to obtuse marginal 1.  This was a large bifurcating vessel.  There was some plaque just before the bifurcation, so the vessel was grafted onto the first branch of the Y, but the anastomosis extended back across the origin. The artery was a 1.5 mm good quality target.  The vein was anastomosed end-to-side with a running 7-0 Prolene suture.  All anastomoses were probed proximally and distally at their completion to ensure patency. Cardioplegia was administered down the vein graft.  There was good flow and good hemostasis.  A reversed saphenous vein graft was placed end-to-side to the first diagonal branch to the LAD.  This was a 1.5 mm good quality target.  The vein was anastomosed end-to-side with a running 7-0 Prolene suture. There was good flow through the graft with cardioplegia administration and good hemostasis.  Next, the right internal mammary artery  was brought through a window in the right side of the pericardium.  The distal end was beveled.  It was anastomosed end-to-side to the posterior descending.  The posterior descending was a 1.5 mm target.  There was heavy disease proximally and mild disease distally.  The right mammary was anastomosed end-to-side with a running 8-0 Prolene suture.  At the completion of anastomosis, the bulldog clamp was removed.  A good flash of blood was seen distally. The bulldog clamp was replaced.  There was good hemostasis at the anastomosis.  The mammary pedicle was tacked to the epicardial surface of the heart with 6-0 Prolene sutures.  Additional cardioplegia was administered.  The left internal mammary artery was brought through a window in the pericardium.  The distal end was beveled.  It was anastomosed end-to-side to the LAD.  The LAD was a 1.5 mm good quality target at the site of anastomosis.  The mammary was a 2 mm good quality conduit.  The anastomosis was performed with a running 8-0 Prolene suture.  At the completion of anastomosis, the  bulldog clamp was removed.  There was good hemostasis and rapid septal rewarming.  The bulldog clamp was replaced and the mammary pedicle was tacked to the epicardial surface of the heart.  While rewarming was in progress, the cardioplegia cannula was removed from the ascending aorta.  The vein graft to the OM vein was beveled and the proximal anastomosis was performed to a 4.5 mm punch aortotomy with a running 6-0 Prolene suture.  The vein to the diagonal was too small to graft directly to the aorta.  At completion of the OM proximal, the patient was placed in Trendelenburg position.  Lidocaine was administered.  The bulldog clamps removed from both mammary arteries. The aortic root was de-aired and the aortic crossclamp was removed.  The total crossclamp time was 65 minutes.  The patient required 2 defibrillations with 20 joules and then was in a  bradycardic rhythm thereafter.  Bulldog clamps were placed proximally and distally on the OM vein.  A venotomy was made and the proximal anastomosis for the diagonal vein was sewn end-to-side with a running 7-0 Prolene suture. All proximal and distal anastomoses were inspected for hemostasis. Epicardial pacing wires were placed on the right ventricle and right atrium and DDD pacing was initiated.  When the patient had rewarmed to a core temperature of 37 degrees Celsius, he was weaned from cardiopulmonary bypass on the first attempt.  He was on a dopamine infusion and paced at the time of separation from bypass.  Total bypass time was 115 minutes.  The initial cardiac index was greater than 2 L/min/m2 and the patient remained hemodynamically stable throughout the post bypass period.  A test dose of protamine was administered and was well tolerated.  The atrial and aortic cannulae were removed.  The remainder of the protamine was administered without incident.  The chest was irrigated with warm saline.  Hemostasis was achieved.  The pericardium was reapproximated over the ascending aorta with interrupted 3-0 silk sutures.  Bilateral pleural tubes and a single mediastinal tube were placed through separate subcostal incisions and secured with #1 silk sutures.  The sternum was closed with a combination of single and double heavy gauge stainless steel wires.  The pectoralis fascia, subcutaneous tissue, and skin were closed in standard fashion.  All sponge, needle, and instrument counts were correct at the end of the procedure.  The patient was taken from the operating room to the surgical intensive care unit in good condition.     Salvatore Decent Dorris Fetch, M.D.     SCH/MEDQ  D:  11/17/2016  T:  11/17/2016  Job:  [id number]

## 2016-11-17 NOTE — Anesthesia Procedure Notes (Signed)
Central Venous Catheter Insertion Performed by: Shelton SilvasHOLLIS, Dennis Paul, anesthesiologist Start/End2/06/2017 6:53 AM, 11/17/2016 6:55 AM Patient location: Pre-op. Preanesthetic checklist: patient identified, IV checked, site marked, risks and benefits discussed, surgical consent, monitors and equipment checked, pre-op evaluation, timeout performed and anesthesia consent Hand hygiene performed  and maximum sterile barriers used  PA cath was placed.Swan type:thermodilution Procedure performed using ultrasound guided technique. Ultrasound Notes:anatomy identified, needle tip was noted to be adjacent to the nerve/plexus identified, no ultrasound evidence of intravascular and/or intraneural injection and image(s) printed for medical record Attempts: 1 Patient tolerated the procedure well with no immediate complications.

## 2016-11-17 NOTE — Procedures (Signed)
Extubation Procedure Note  Patient Details:   Name: Kourtland Coopman DOB: 09-14-1951 MRN: [id number]   Airway Documentation:  Airway 8 mm (Active)  Secured at (cm) 23 cm 11/17/2016  6:40 PM  Measured From Lips 11/17/2016  6:40 PM  Secured Location Right 11/17/2016  6:40 PM  Secured By Pink Tape 11/17/2016  6:40 PM  Site Condition Dry 11/17/2016  6:40 PM    Evaluation  O2 sats: stable throughout Complications: No apparent complications Patient did tolerate procedure well. Bilateral Breath Sounds: Clear, Diminished   Yes  Pt Extubated to 4 L [place], SATS 98% Prior to extubation ABG met protocol, NIF -42, VC 1000 with good efforts. Post- extubation IS 300-350 x 10 breaths, with good slow deep breaths. Vent pulled from room.   Clare Gandy, Chales Abrahams 11/17/2016, 8:08 PM

## 2016-11-17 NOTE — Transfer of Care (Signed)
Immediate Anesthesia Transfer of Care Note  Patient: Dennis KaysJeremiah Paul  Procedure(s) Performed: Procedure(s): CORONARY ARTERY BYPASS GRAFTING (CABG)x4   LIMA-LAD DVG-OM SVG-PD SVG-DIAG (N/A) TRANSESOPHAGEAL ECHOCARDIOGRAM (TEE) (N/A)  Patient Location: SICU  Anesthesia Type:General  Level of Consciousness: sedated and Patient remains intubated per anesthesia plan  Airway & Oxygen Therapy: Patient remains intubated per anesthesia plan and Patient placed on Ventilator (see vital sign flow sheet for setting)  Post-op Assessment: Report given to RN and Post -op Vital signs reviewed and stable  Post vital signs: Reviewed and stable  Last Vitals:  Vitals:   11/17/16 0400 11/17/16 0500  BP: 129/82 136/83  Pulse: (!) 44 (!) 53  Resp: 13 13  Temp:      Last Pain:  Vitals:   11/17/16 0300  TempSrc: Oral  PainSc:          Complications: No apparent anesthesia complications

## 2016-11-17 NOTE — Anesthesia Procedure Notes (Signed)
Central Venous Catheter Insertion Performed by: Shelton SilvasHOLLIS, Karliah Kowalchuk D, anesthesiologist Start/End2/06/2017 6:35 AM, 11/17/2016 6:45 AM Patient location: Pre-op. Preanesthetic checklist: patient identified, IV checked, site marked, risks and benefits discussed, surgical consent, monitors and equipment checked, pre-op evaluation, timeout performed and anesthesia consent Position: Trendelenburg Lidocaine 1% used for infiltration and patient sedated Hand hygiene performed , maximum sterile barriers used  and Seldinger technique used Catheter size: 8.5 Fr Total catheter length 10. Central line was placed.Sheath introducer Swan type:thermodilution PA Cath depth:50 Procedure performed using ultrasound guided technique. Ultrasound Notes:anatomy identified, needle tip was noted to be adjacent to the nerve/plexus identified, no ultrasound evidence of intravascular and/or intraneural injection and image(s) printed for medical record Attempts: 1 Following insertion, line sutured and dressing applied. Post procedure assessment: blood return through all ports, free fluid flow and no air  Patient tolerated the procedure well with no immediate complications.

## 2016-11-17 NOTE — Brief Op Note (Addendum)
11/16/2016 - 11/17/2016  12:34 PM  PATIENT:  Marja KaysJeremiah Shatswell  66 y.o. male  PRE-OPERATIVE DIAGNOSIS:  CAD, s/p STEMI  POST-OPERATIVE DIAGNOSIS:  CAD, s/p STEMI  PROCEDURE:  Procedure(s): CORONARY ARTERY BYPASS GRAFTING (CABG) (N/A) TRANSESOPHAGEAL ECHOCARDIOGRAM (TEE) (N/A)  LIMA-LAD  RIMA-PD  SVG-OM  SVG-DIAG  SURGEON:  Surgeon(s) and Role:    * Loreli SlotSteven C Dardan Shelton, MD - Primary  PHYSICIAN ASSISTANT: WAYNE GOLD PA-C  ANESTHESIA:   general  EBL:  Total I/O In: 1800 [I.V.:1800] Out: 4600 [Urine:4600]  BLOOD ADMINISTERED:none  DRAINS: ROUTINE   LOCAL MEDICATIONS USED:  NONE  SPECIMEN:  No Specimen  DISPOSITION OF SPECIMEN:  N/A  COUNTS:  YES  PLAN OF CARE: Admit to inpatient   PATIENT DISPOSITION:  ICU - intubated and hemodynamically stable.   Delay start of Pharmacological VTE agent (>24hrs) due to surgical blood loss or risk of bleeding: yes  COMPLICATIONS: NO KNOWN  XC= 65 min CPB= 115 min  Vein fair quality. IMAs good quality

## 2016-11-17 NOTE — Anesthesia Procedure Notes (Signed)
Procedure Name: Intubation Date/Time: 11/17/2016 7:48 AM Performed by: Jacquiline Doe A Pre-anesthesia Checklist: Patient identified, Emergency Drugs available, Suction available and Patient being monitored Patient Re-evaluated:Patient Re-evaluated prior to inductionOxygen Delivery Method: Circle System Utilized and Circle system utilized Preoxygenation: Pre-oxygenation with 100% oxygen Intubation Type: IV induction Ventilation: Mask ventilation without difficulty and Oral airway inserted - appropriate to patient size Laryngoscope Size: Mac and 4 Grade View: Grade I Tube type: Oral Tube size: 8.0 mm Number of attempts: 1 Airway Equipment and Method: Stylet Placement Confirmation: ETT inserted through vocal cords under direct vision,  positive ETCO2 and breath sounds checked- equal and bilateral Secured at: 23 cm Tube secured with: Tape Dental Injury: Teeth and Oropharynx as per pre-operative assessment

## 2016-11-17 NOTE — Progress Notes (Signed)
RN trying to reinforce  teaching to patient prior to heart surgery, attempted to call video education extension but no answer, tried to call every unit or floor if theres any changes in the telephone extension for education video and they said no. Patient appears  interested in watching the video, was given a pre-op book for CABG.  Called [facility] [address] spoke to [employer]Lauren Kennedy RN  expressed my frustration of not able to show the education video to the patient since the phone is just keep ringing and no answer probably the system is down and she was so very accomodating  and was willing to come up and talk to the patient. She came and  was able to give a thorough teaching to patient with emphasis in the recovery process, which patient verbalized. Since this is patient's first  major surgery he  was satisfied, felt at ease  and was grateful that Lauren came and gave some basic knowledge and teaching what to expect after surgery.

## 2016-11-17 NOTE — Progress Notes (Signed)
      [address]       [place] [id number]             [phone]      S/p CABG x 4  Intubated, waking up and following commands  BP 107/80   Pulse 80   Temp 98.1 F (36.7 C)   Resp 14   Ht 5\' 9"  (1.753 m)   Wt 207 lb 12.8 oz (94.3 kg)   SpO2 97%   BMI 30.69 kg/m   CI= 1.88 off dopamine  Intake/Output Summary (Last 24 hours) at 11/17/16 1809 Last data filed at 11/17/16 1800  Gross per 24 hour  Intake          4555.89 ml  Output             7500 ml  Net         -2944.11 ml   HCT= 29, K= 4.2  Doing well early postop  Viviann SpareSteven C. Dorris FetchHendrickson, MD [facility] [phone]

## 2016-11-17 NOTE — H&P (View-Only) (Signed)
Reason for Consult:3 vessel CAD s/p STEMI Referring Physician: Dr. Martinique  Dennis Paul is an 66 y.o. male.  HPI: 66 yo man presented with a cc/o CP  Dennis Paul is a 66 yo man with a past history significant for hypertension and GERD. He has no prior cardiac history. He has been poorly for the past week with cough, nasal congestion, but no fevers. He has had 3 episodes of Cp in the past week. The first 2 were relatively minor and resolved without incident. This morning he went out to his truck and had sudden onset of severe substernal CP radiating to his left arm. He had some nausea and became diaphoretic. He went to the [facility] ED and ECG showed ST elevation. He was given ASA, heparin, NTG, morphine and transferred directly to the cath lab. Pain resolved prior to arriving in cath lab.  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 He currently is pain free.  Past Medical History:  Diagnosis Date  . Asthma    had as a child, wheezes now w/ URI  . GERD (gastroesophageal reflux disease)   . Hiatal hernia   . HTN (hypertension)     Past Surgical History:  Procedure Laterality Date  . LEFT HEART CATH AND CORONARY ANGIOGRAPHY N/A 11/16/2016   Procedure: Left Heart Cath and Coronary Angiography;  Surgeon: Peter M Martinique, MD;  Location: [facility];  Service: Cardiovascular;  Laterality: N/A;    Family History  Problem Relation Age of Onset  . Hypertension Mother   . Hypertension Father     Social History:  reports that he has never smoked. He has never used smokeless tobacco. He reports that he does not drink alcohol or use drugs.  Allergies: No Known Allergies  Medications:  Prior to Admission:  No prescriptions prior to admission.    Results for orders placed or performed during the hospital encounter of 11/16/16 (from the past 48 hour(s))  POCT Activated clotting time     Status: None   Collection Time: 11/16/16  1:18 PM  Result Value Ref  Range   Activated Clotting Time 257 seconds  I-STAT, chem 8     Status: Abnormal   Collection Time: 11/16/16  1:19 PM  Result Value Ref Range   Sodium 141 135 - 145 mmol/L   Potassium 3.8 3.5 - 5.1 mmol/L   Chloride 105 101 - 111 mmol/L   BUN 19 6 - 20 mg/dL   Creatinine, Ser 1.20 0.61 - 1.24 mg/dL   Glucose, Bld 129 (H) 65 - 99 mg/dL   Calcium, Ion 1.21 1.15 - 1.40 mmol/L   TCO2 24 0 - 100 mmol/L   Hemoglobin 12.6 (L) 13.0 - 17.0 g/dL   HCT 37.0 (L) 39.0 - 52.0 %  Comprehensive metabolic panel     Status: Abnormal   Collection Time: 11/16/16  1:22 PM  Result Value Ref Range   Sodium 138 135 - 145 mmol/L   Potassium 3.9 3.5 - 5.1 mmol/L   Chloride 107 101 - 111 mmol/L   CO2 23 22 - 32 mmol/L   Glucose, Bld 126 (H) 65 - 99 mg/dL   BUN 17 6 - 20 mg/dL   Creatinine, Ser 1.25 (H) 0.61 - 1.24 mg/dL   Calcium 8.5 (L) 8.9 - 10.3 mg/dL   Total Protein 6.5 6.5 - 8.1 g/dL   Albumin 3.9 3.5 - 5.0 g/dL   AST 35 15 - 41 U/L   ALT 25 17 -  63 U/L   Alkaline Phosphatase 75 38 - 126 U/L   Total Bilirubin 0.8 0.3 - 1.2 mg/dL   GFR calc non Af Amer 58 (L) >60 mL/min   GFR calc Af Amer >60 >60 mL/min    Comment: (NOTE) The eGFR has been calculated using the CKD EPI equation. This calculation has not been validated in all clinical situations. eGFR's persistently <60 mL/min signify possible Chronic Kidney Disease.    Anion gap 8 5 - 15  Lipid panel     Status: Abnormal   Collection Time: 11/16/16  1:22 PM  Result Value Ref Range   Cholesterol 191 0 - 200 mg/dL   Triglycerides 127 <150 mg/dL   HDL 29 (L) >40 mg/dL   Total CHOL/HDL Ratio 6.6 RATIO   VLDL 25 0 - 40 mg/dL   LDL Cholesterol 137 (H) 0 - 99 mg/dL    Comment:        Total Cholesterol/HDL:CHD Risk Coronary Heart Disease Risk Table                     Men   Women  1/2 Average Risk   3.4   3.3  Average Risk       5.0   4.4  2 X Average Risk   9.6   7.1  3 X Average Risk  23.4   11.0        Use the calculated Patient  Ratio above and the CHD Risk Table to determine the patient's CHD Risk.        ATP III CLASSIFICATION (LDL):  <100     mg/dL   Optimal  100-129  mg/dL   Near or Above                    Optimal  130-159  mg/dL   Borderline  160-189  mg/dL   High  >190     mg/dL   Very High   CBC     Status: Abnormal   Collection Time: 11/16/16  1:22 PM  Result Value Ref Range   WBC 8.6 4.0 - 10.5 K/uL   RBC 4.44 4.22 - 5.81 MIL/uL   Hemoglobin 13.2 13.0 - 17.0 g/dL   HCT 38.9 (L) 39.0 - 52.0 %   MCV 87.6 78.0 - 100.0 fL   MCH 29.7 26.0 - 34.0 pg   MCHC 33.9 30.0 - 36.0 g/dL   RDW 12.7 11.5 - 15.5 %   Platelets 182 150 - 400 K/uL  Protime-INR     Status: None   Collection Time: 11/16/16  1:22 PM  Result Value Ref Range   Prothrombin Time 14.3 11.4 - 15.2 seconds   INR 1.11   APTT     Status: Abnormal   Collection Time: 11/16/16  1:22 PM  Result Value Ref Range   aPTT >200 (HH) 24 - 36 seconds    Comment: REPEATED TO VERIFY CRITICAL RESULT CALLED TO, READ BACK BY AND VERIFIED WITH: S.JACKSON,RN 11/16/16 '@1443'$  BY V.WILKINS   Troponin I     Status: Abnormal   Collection Time: 11/16/16  1:22 PM  Result Value Ref Range   Troponin I 1.31 (HH) <0.03 ng/mL    Comment: CRITICAL RESULT CALLED TO, READ BACK BY AND VERIFIED WITH: OLEARY,T RN @ 2458 11/16/16 LEONARD,A   MRSA PCR Screening     Status: None   Collection Time: 11/16/16  2:41 PM  Result Value Ref Range   MRSA by PCR  NEGATIVE NEGATIVE    Comment:        The GeneXpert MRSA Assay (FDA approved for NASAL specimens only), is one component of a comprehensive MRSA colonization surveillance program. It is not intended to diagnose MRSA infection nor to guide or monitor treatment for MRSA infections.     CARDIAC CATH Conclusion     Mid RCA lesion, 70 %stenosed.  RPDA lesion, 95 %stenosed.  Prox Cx to Mid Cx lesion, 50 %stenosed.  Prox LAD lesion, 90 %stenosed.  Mid LAD lesion, 50 %stenosed.  Ost 2nd Diag lesion, 90  %stenosed.  There is mild to moderate left ventricular systolic dysfunction.  LV end diastolic pressure is normal.   1. Severe 3 vessel obstructive CAD 2. Mild to moderate LV dysfunction 3. 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 The patient is now pain free and ST elevation is improved. He has complex 3 vessel disease with ulcerated plaque in the proximal LAD and bifurcation disease at the take off of a large diagonal branch. Given the complexity of disease I think he would be best managed with CABG. Will maximize his medical therapy and consult CT surgery.      I personally reviewed the cath images and concur with the findings noted above  Review of Systems  Constitutional: Positive for malaise/fatigue. Negative for chills and fever.  Respiratory: Positive for cough and wheezing.   Cardiovascular: Positive for chest pain and palpitations. Negative for orthopnea, claudication, leg swelling and PND.  Gastrointestinal: Positive for heartburn and nausea. Negative for blood in stool.  Genitourinary: Negative for dysuria and urgency.  Neurological: Negative for focal weakness, seizures and loss of consciousness.  Endo/Heme/Allergies: Does not bruise/bleed easily.  All other systems reviewed and are negative.  Blood pressure 108/78, pulse (!) 56, temperature 98.1 F (36.7 C), temperature source Oral, resp. rate (!) 9, height '5\' 9"'$  (1.753 m), weight 207 lb 14.3 oz (94.3 kg), SpO2 97 %. Physical Exam  Vitals reviewed. Constitutional: He is oriented to person, place, and time. He appears well-developed and well-nourished. No distress.  HENT:  Head: Normocephalic and atraumatic.  Mouth/Throat: No oropharyngeal exudate.  Eyes: Conjunctivae and EOM are normal. No scleral icterus.  Neck: Neck supple. No thyromegaly present.  Cardiovascular: Normal rate, regular rhythm, normal heart sounds and intact distal pulses.  Exam reveals no gallop and no friction rub.    No murmur heard. Respiratory: Breath sounds normal. No respiratory distress. He has no wheezes. He has no rales.  GI: Soft. He exhibits no distension. There is no tenderness.  Musculoskeletal: Normal range of motion. He exhibits no edema.  Lymphadenopathy:    He has no cervical adenopathy.  Neurological: He is alert and oriented to person, place, and time. No cranial nerve deficit.  No focal motor deficits  Skin: Skin is warm and dry.    Assessment/Plan: 66 yo man with no prior cardiac history presents with a STEMI. Fortunately the MI aborted with initiation of medical therapy. At cath he has severe 3 vessel CAD with inferior akinesis. CABG is indicated for survival benefit and relief of symptoms.  I have discussed the general nature of the procedure, the need for general anesthesia, the use of cardiopulmonary bypass, and the incisions to be used with Dennis Paul. We discussed the expected hospital stay, overall recovery and short and long term outcomes. I informed him of the indications, risks, benefits and alternatives. He understands the risks include, but are not limited to  death, stroke, MI, DVT/PE, bleeding, possible need for transfusion, infections, cardiac arrhythmias, as well as other organ system dysfunction including respiratory, renal, or GI complications.  He accepts the risks and agrees to proceed.  For CABG in AM  Melrose Nakayama 11/16/2016, 4:55 PM

## 2016-11-17 NOTE — Progress Notes (Signed)
  Echocardiogram Echocardiogram Transesophageal has been performed.  Janalyn HarderWest, Walter Min R 11/17/2016, 9:13 AM

## 2016-11-17 NOTE — Anesthesia Preprocedure Evaluation (Signed)
Anesthesia Evaluation  Patient identified by MRN, date of birth, ID band Patient awake    Reviewed: Allergy & Precautions, NPO status , Patient's Chart, lab work & pertinent test results  Airway Mallampati: II  TM Distance: >3 FB Neck ROM: Full    Dental no notable dental hx.    Pulmonary neg pulmonary ROS,    Pulmonary exam normal breath sounds clear to auscultation       Cardiovascular hypertension, + CAD and + Past MI  Normal cardiovascular exam Rhythm:Regular Rate:Normal  The left ventricular size is normal. There is mild to moderate left ventricular systolic dysfunction. LV end diastolic pressure is normal. Calculated EF is 45%. There are LV function abnormalities due to segmental dysfunction.   Neuro/Psych negative neurological ROS  negative psych ROS   GI/Hepatic negative GI ROS, Neg liver ROS,   Endo/Other  negative endocrine ROS  Renal/GU negative Renal ROS  negative genitourinary   Musculoskeletal negative musculoskeletal ROS (+)   Abdominal   Peds negative pediatric ROS (+)  Hematology negative hematology ROS (+)   Anesthesia Other Findings   Reproductive/Obstetrics negative OB ROS                             Anesthesia Physical Anesthesia Plan  ASA: IV  Anesthesia Plan: General   Post-op Pain Management:    Induction: Intravenous  Airway Management Planned: Oral ETT  Additional Equipment: Arterial line, CVP, PA Cath, Ultrasound Guidance Line Placement and TEE  Intra-op Plan:   Post-operative Plan: Post-operative intubation/ventilation  Informed Consent: I have reviewed the patients History and Physical, chart, labs and discussed the procedure including the risks, benefits and alternatives for the proposed anesthesia with the patient or authorized representative who has indicated his/her understanding and acceptance.   Dental advisory given  Plan Discussed with:  CRNA and Surgeon  Anesthesia Plan Comments:         Anesthesia Quick Evaluation

## 2016-11-17 NOTE — Interval H&P Note (Signed)
History and Physical Interval Note:  11/17/2016 7:18 AM  Dennis Paul  has presented today for surgery, with the diagnosis of CAD  The various methods of treatment have been discussed with the patient and family. After consideration of risks, benefits and other options for treatment, the patient has consented to  Procedure(s): CORONARY ARTERY BYPASS GRAFTING (CABG) (N/A) TRANSESOPHAGEAL ECHOCARDIOGRAM (TEE) (N/A) as a surgical intervention .  The patient's history has been reviewed, patient examined, no change in status, stable for surgery.  I have reviewed the patient's chart and labs.  Questions were answered to the patient's satisfaction.     Loreli SlotSteven C Clare Fennimore

## 2016-11-18 ENCOUNTER — Inpatient Hospital Stay (HOSPITAL_COMMUNITY): Payer: Medicare Other

## 2016-11-18 LAB — GLUCOSE, CAPILLARY
GLUCOSE-CAPILLARY: 110 mg/dL — AB (ref 65–99)
GLUCOSE-CAPILLARY: 104 mg/dL — AB (ref 65–99)
Glucose-Capillary: 110 mg/dL — ABNORMAL HIGH (ref 65–99)
Glucose-Capillary: 130 mg/dL — ABNORMAL HIGH (ref 65–99)
Glucose-Capillary: 94 mg/dL (ref 65–99)

## 2016-11-18 LAB — MAGNESIUM
MAGNESIUM: 2.4 mg/dL (ref 1.7–2.4)
MAGNESIUM: 2.4 mg/dL (ref 1.7–2.4)

## 2016-11-18 LAB — BASIC METABOLIC PANEL
Anion gap: 8 (ref 5–15)
BUN: 13 mg/dL (ref 6–20)
CALCIUM: 7.9 mg/dL — AB (ref 8.9–10.3)
CO2: 22 mmol/L (ref 22–32)
CREATININE: 1.28 mg/dL — AB (ref 0.61–1.24)
Chloride: 107 mmol/L (ref 101–111)
GFR calc Af Amer: >60 mL/min (ref >60)
GFR, EST NON AFRICAN AMERICAN: 57 mL/min — AB (ref >60)
GLUCOSE: 110 mg/dL — AB (ref 65–99)
POTASSIUM: 4.9 mmol/L (ref 3.5–5.1)
SODIUM: 137 mmol/L (ref 135–145)

## 2016-11-18 LAB — POCT I-STAT, CHEM 8
BUN: 20 mg/dL (ref 6–20)
Calcium, Ion: 1.18 mmol/L (ref 1.15–1.40)
Chloride: 103 mmol/L (ref 101–111)
Creatinine, Ser: 1.2 mg/dL (ref 0.61–1.24)
Glucose, Bld: 119 mg/dL — ABNORMAL HIGH (ref 65–99)
HEMATOCRIT: 24 % — AB (ref 39.0–52.0)
Hemoglobin: 8.2 g/dL — ABNORMAL LOW (ref 13.0–17.0)
POTASSIUM: 4.2 mmol/L (ref 3.5–5.1)
SODIUM: 140 mmol/L (ref 135–145)
TCO2: 25 mmol/L (ref 0–100)

## 2016-11-18 LAB — CBC
HCT: 25 % — ABNORMAL LOW (ref 39.0–52.0)
HEMATOCRIT: 23.8 % — AB (ref 39.0–52.0)
HEMOGLOBIN: 8 g/dL — AB (ref 13.0–17.0)
Hemoglobin: 8.3 g/dL — ABNORMAL LOW (ref 13.0–17.0)
MCH: 29 pg (ref 26.0–34.0)
MCH: 29.5 pg (ref 26.0–34.0)
MCHC: 33.2 g/dL (ref 30.0–36.0)
MCHC: 33.6 g/dL (ref 30.0–36.0)
MCV: 87.4 fL (ref 78.0–100.0)
MCV: 87.8 fL (ref 78.0–100.0)
Platelets: 124 10*3/uL — ABNORMAL LOW (ref 150–400)
Platelets: 118 10*3/uL — ABNORMAL LOW (ref 150–400)
RBC: 2.86 MIL/uL — AB (ref 4.22–5.81)
RBC: 2.71 MIL/uL — ABNORMAL LOW (ref 4.22–5.81)
RDW: 13 % (ref 11.5–15.5)
RDW: 13.3 % (ref 11.5–15.5)
WBC: 9 10*3/uL (ref 4.0–10.5)
WBC: 10.5 10*3/uL (ref 4.0–10.5)

## 2016-11-18 LAB — CREATININE, SERUM
CREATININE: 1.27 mg/dL — AB (ref 0.61–1.24)
GFR calc Af Amer: >60 mL/min (ref >60)
GFR, EST NON AFRICAN AMERICAN: 57 mL/min — AB (ref >60)

## 2016-11-18 MED ORDER — INSULIN ASPART 100 UNIT/ML ~~LOC~~ SOLN: 0.0000 [IU] | SUBCUTANEOUS | Status: DC

## 2016-11-18 MED ORDER — ALBUMIN HUMAN 5 % IV SOLN
INTRAVENOUS | Status: AC
  Filled 2016-11-18: qty 250

## 2016-11-18 MED ORDER — ENOXAPARIN SODIUM 40 MG/0.4ML ~~LOC~~ SOLN
40.0000 mg | Freq: Every day | SUBCUTANEOUS | Status: DC
  Administered 2016-11-18 – 2016-11-23 (×6): 40 mg via SUBCUTANEOUS
  Filled 2016-11-18 (×6): qty 0.4

## 2016-11-18 MED ORDER — ALBUMIN HUMAN 5 % IV SOLN
12.5000 g | Freq: Once | INTRAVENOUS | Status: AC
  Administered 2016-11-18: 12.5 g via INTRAVENOUS

## 2016-11-18 MED ORDER — INSULIN ASPART 100 UNIT/ML ~~LOC~~ SOLN
0.0000 [IU] | Freq: Three times a day (TID) | SUBCUTANEOUS | Status: DC
  Administered 2016-11-19 (×2): 2 [IU] via SUBCUTANEOUS

## 2016-11-18 NOTE — Progress Notes (Signed)
1 Day Post-Op Procedure(s) (LRB): CORONARY ARTERY BYPASS GRAFTING (CABG)x4   LIMA-LAD DVG-OM SVG-PD SVG-DIAG (N/A) TRANSESOPHAGEAL ECHOCARDIOGRAM (TEE) (N/A) Subjective: Some incisional pain  Objective: Vital signs in last 24 hours: Temp:  [96.1 F (35.6 C)-101.5 F (38.6 C)] 98.4 F (36.9 C) (02/10 0615) Pulse Rate:  [73-111] 90 (02/10 0615) Cardiac Rhythm: Atrial paced (02/10 0200) Resp:  [11-23] 15 (02/10 0615) BP: (74-107)/(47-80) 86/65 (02/10 0600) SpO2:  [83 %-100 %] 100 % (02/10 0615) Arterial Line BP: (76-131)/(45-70) 88/49 (02/10 0615) FiO2 (%):  [40 %-50 %] 40 % (02/09 1900) Weight:  [215 lb 9.8 oz (97.8 kg)] 215 lb 9.8 oz (97.8 kg) (02/10 0445)  Hemodynamic parameters for last 24 hours: PAP: (18-36)/(5-20) 20/10 CO:  [4.5 L/min] 4.5 L/min CI:  [2.2 L/min/m2] 2.2 L/min/m2  Intake/Output from previous day: 02/09 0701 - 02/10 0700 In: 6073.2 [P.O.:240; I.V.:4983.2; Blood:200; IV Piggyback:650] Out: 7828 [Urine:7080; Blood:400; Chest Tube:348] Intake/Output this shift: No intake/output data recorded.  General appearance: alert, cooperative and no distress Neurologic: intact Heart: regular rate and rhythm Lungs: diminished breath sounds bibasilar Abdomen: normal findings: soft, non-tender  Lab Results:  Recent Labs  11/17/16 2005 11/17/16 2111 11/18/16 0321  WBC 11.9*  --  9.0  HGB 8.7* 7.8* 8.3*  HCT 26.2* 23.0* 25.0*  PLT 165  --  124*   BMET:  Recent Labs  11/17/16 0211  11/17/16 2111 11/18/16 0321  NA 138  < > 141 137  K 4.2  < > 4.2 4.9  CL 107  < > 105 107  CO2 22  --   --  22  GLUCOSE 99  < > 132* 110*  BUN 12  < > 14 13  CREATININE 1.18  < > 1.20 1.28*  CALCIUM 8.8*  --   --  7.9*  < > = values in this interval not displayed.  PT/INR:  Recent Labs  11/17/16 1442  LABPROT 16.4*  INR 1.31   ABG    Component Value Date/Time   PHART 7.301 (L) 11/17/2016 2117   HCO3 24.2 11/17/2016 2117   TCO2 26 11/17/2016 2117   ACIDBASEDEF 2.0  11/17/2016 2117   O2SAT 97.0 11/17/2016 2117   CBG (last 3)   Recent Labs  11/17/16 2321 11/18/16 0055 11/18/16 0317  GLUCAP 102* 94 110*    Assessment/Plan: S/P Procedure(s) (LRB): CORONARY ARTERY BYPASS GRAFTING (CABG)x4   LIMA-LAD DVG-OM SVG-PD SVG-DIAG (N/A) TRANSESOPHAGEAL ECHOCARDIOGRAM (TEE) (N/A) -  CV- s/p CABG x 4  Bradycardic- dc lopressor, AAI, follow  Dc swan and A line  RESP- IS for atelectasis  RENAL- creatinine mildly elevated, follow  ENDO- CBG well controlled  Anemia secondary to ABL- follow  DC chest tubes  mobilize   LOS: 2 days    Loreli SlotSteven C Kerianna Rawlinson 11/18/2016

## 2016-11-18 NOTE — Progress Notes (Signed)
      [address]       [place] [id number]             [phone]      Up in chair  No complaints  BP 102/70   Pulse 85   Temp 98.2 F (36.8 C) (Oral)   Resp 16   Ht 5\' 9"  (1.753 m)   Wt 215 lb 9.8 oz (97.8 kg)   SpO2 97%   BMI 31.84 kg/m    Intake/Output Summary (Last 24 hours) at 11/18/16 1755 Last data filed at 11/18/16 1700  Gross per 24 hour  Intake          1420.16 ml  Output             1351 ml  Net            69.16 ml   Creatinine =1 .27 Hct= 24  Doing well POD # 1  Kj Imbert C. Dorris FetchHendrickson, MD [facility] [phone]

## 2016-11-18 NOTE — Progress Notes (Signed)
   Post CABG  Watch HR. Paced now.   Off Bb  Will follow.   Donato SchultzMark Skains, MD

## 2016-11-18 NOTE — Anesthesia Postprocedure Evaluation (Addendum)
Anesthesia Post Note  Patient: Dennis Paul  Procedure(s) Performed: Procedure(s) (LRB): CORONARY ARTERY BYPASS GRAFTING (CABG)x4   LIMA-LAD DVG-OM SVG-PD SVG-DIAG (N/A) TRANSESOPHAGEAL ECHOCARDIOGRAM (TEE) (N/A)  Patient location during evaluation: SICU Anesthesia Type: General Level of consciousness: sedated Pain management: pain level controlled Vital Signs Assessment: post-procedure vital signs reviewed and stable Respiratory status: patient remains intubated per anesthesia plan Cardiovascular status: stable Anesthetic complications: no       Last Vitals:  Vitals:   11/18/16 0600 11/18/16 0615  BP: (!) 86/65   Pulse: 90 90  Resp: 15 15  Temp: 36.9 C 36.9 C    Last Pain:  Vitals:   11/18/16 0000  TempSrc:   PainSc: 0-No pain                 Dennis Paul S

## 2016-11-18 NOTE — Plan of Care (Signed)
Problem: Activity: Goal: Risk for activity intolerance will decrease Outcome: Progressing Patient ambulated twice today, tolerated well   Problem: Bowel/Gastric: Goal: Gastrointestinal status for postoperative course will improve Outcome: Progressing Pt without nausea or bloating, says he is passing some gas but mostly burping today  Problem: Cardiac: Goal: Hemodynamic stability will improve Outcome: Progressing soft BP

## 2016-11-19 ENCOUNTER — Inpatient Hospital Stay (HOSPITAL_COMMUNITY): Payer: Medicare Other

## 2016-11-19 DIAGNOSIS — Z951 Presence of aortocoronary bypass graft: Secondary | ICD-10-CM

## 2016-11-19 DIAGNOSIS — I471 Supraventricular tachycardia: Secondary | ICD-10-CM

## 2016-11-19 LAB — GLUCOSE, CAPILLARY
Glucose-Capillary: 121 mg/dL — ABNORMAL HIGH (ref 65–99)
Glucose-Capillary: 129 mg/dL — ABNORMAL HIGH (ref 65–99)
Glucose-Capillary: 114 mg/dL — ABNORMAL HIGH (ref 65–99)
Glucose-Capillary: 102 mg/dL — ABNORMAL HIGH (ref 65–99)

## 2016-11-19 LAB — CBC
HEMATOCRIT: 23.7 % — AB (ref 39.0–52.0)
HEMOGLOBIN: 7.7 g/dL — AB (ref 13.0–17.0)
MCH: 29.2 pg (ref 26.0–34.0)
MCHC: 32.5 g/dL (ref 30.0–36.0)
MCV: 89.8 fL (ref 78.0–100.0)
Platelets: 126 10*3/uL — ABNORMAL LOW (ref 150–400)
RBC: 2.64 MIL/uL — ABNORMAL LOW (ref 4.22–5.81)
RDW: 13.6 % (ref 11.5–15.5)
WBC: 9.8 10*3/uL (ref 4.0–10.5)

## 2016-11-19 LAB — BASIC METABOLIC PANEL
ANION GAP: 8 (ref 5–15)
BUN: 21 mg/dL — AB (ref 6–20)
CHLORIDE: 103 mmol/L (ref 101–111)
CO2: 25 mmol/L (ref 22–32)
Calcium: 7.9 mg/dL — ABNORMAL LOW (ref 8.9–10.3)
Creatinine, Ser: 1.38 mg/dL — ABNORMAL HIGH (ref 0.61–1.24)
GFR calc Af Amer: 60 mL/min — ABNORMAL LOW (ref >60)
GFR calc non Af Amer: 52 mL/min — ABNORMAL LOW (ref >60)
GLUCOSE: 117 mg/dL — AB (ref 65–99)
POTASSIUM: 4 mmol/L (ref 3.5–5.1)
Sodium: 136 mmol/L (ref 135–145)

## 2016-11-19 MED ORDER — METOPROLOL TARTRATE 12.5 MG HALF TABLET
12.5000 mg | ORAL_TABLET | Freq: Two times a day (BID) | ORAL | Status: DC
  Administered 2016-11-20 (×2): 12.5 mg via ORAL
  Filled 2016-11-19 (×2): qty 1

## 2016-11-19 MED ORDER — AMIODARONE LOAD VIA INFUSION
150.0000 mg | Freq: Once | INTRAVENOUS | Status: AC
  Administered 2016-11-19: 150 mg via INTRAVENOUS
  Filled 2016-11-19: qty 83.34

## 2016-11-19 MED ORDER — AMIODARONE HCL IN DEXTROSE 360-4.14 MG/200ML-% IV SOLN
60.0000 mg/h | INTRAVENOUS | Status: AC
  Administered 2016-11-19: 60 mg/h via INTRAVENOUS
  Filled 2016-11-19: qty 200

## 2016-11-19 MED ORDER — FUROSEMIDE 40 MG PO TABS
40.0000 mg | ORAL_TABLET | Freq: Every day | ORAL | Status: DC
  Administered 2016-11-19 – 2016-11-23 (×5): 40 mg via ORAL
  Filled 2016-11-19 (×5): qty 1

## 2016-11-19 MED ORDER — POTASSIUM CHLORIDE CRYS ER 20 MEQ PO TBCR
20.0000 meq | EXTENDED_RELEASE_TABLET | Freq: Every day | ORAL | Status: DC
  Administered 2016-11-19 – 2016-11-24 (×6): 20 meq via ORAL
  Filled 2016-11-19: qty 2
  Filled 2016-11-19: qty 1
  Filled 2016-11-19 (×10): qty 2

## 2016-11-19 MED ORDER — AMIODARONE HCL IN DEXTROSE 360-4.14 MG/200ML-% IV SOLN
30.0000 mg/h | INTRAVENOUS | Status: DC
  Administered 2016-11-19: 30 mg/h via INTRAVENOUS
  Filled 2016-11-19 (×2): qty 200

## 2016-11-19 NOTE — Progress Notes (Signed)
      [address]       [place] [id number]             [phone]      Back in SR after having rapid atrial fib earlier  BP 125/83   Pulse 64   Temp 98.3 F (36.8 C) (Oral)   Resp 16   Ht 5\' 9"  (1.753 m)   Wt 215 lb 13.3 oz (97.9 kg)   SpO2 97%   BMI 31.87 kg/m   Intake/Output Summary (Last 24 hours) at 11/19/16 1813 Last data filed at 11/19/16 1600  Gross per 24 hour  Intake            968.2 ml  Output              410 ml  Net            558.2 ml   BP better will restart low dose metoprolol in addition to amiodarone  Viviann SpareSteven C. Dorris FetchHendrickson, MD [facility] [phone]

## 2016-11-19 NOTE — Progress Notes (Signed)
2 Days Post-Op Procedure(s) (LRB): CORONARY ARTERY BYPASS GRAFTING (CABG)x4   LIMA-LAD DVG-OM SVG-PD SVG-DIAG (N/A) TRANSESOPHAGEAL ECHOCARDIOGRAM (TEE) (N/A) Subjective: Feels weak today  Objective: Vital signs in last 24 hours: Temp:  [97.9 F (36.6 C)-98.2 F (36.8 C)] 97.9 F (36.6 C) (02/10 2015) Pulse Rate:  [82-92] 90 (02/11 0700) Cardiac Rhythm: Atrial paced (02/10 2100) Resp:  [14-23] 16 (02/10 1215) BP: (82-114)/(57-74) 97/66 (02/11 0700) SpO2:  [90 %-100 %] 95 % (02/11 0700) Arterial Line BP: (84-124)/(44-81) 103/44 (02/10 1715) Weight:  [215 lb 13.3 oz (97.9 kg)] 215 lb 13.3 oz (97.9 kg) (02/11 0615)  Hemodynamic parameters for last 24 hours: PAP: (22-35)/(11-19) 35/19  Intake/Output from previous day: 02/10 0701 - 02/11 0700 In: 1377.5 [P.O.:720; I.V.:557.5; IV Piggyback:100] Out: 735 [Urine:735] Intake/Output this shift: No intake/output data recorded.  General appearance: alert, cooperative and no distress Neurologic: intact Heart: regular rate and rhythm Lungs: diminished breath sounds bibasilar Abdomen: normal findings: soft, non-tender  Lab Results:  Recent Labs  11/18/16 1645 11/19/16 0400  WBC 10.5 9.8  HGB 8.0* 7.7*  HCT 23.8* 23.7*  PLT 118* 126*   BMET:  Recent Labs  11/18/16 0321 11/18/16 1639 11/18/16 1645 11/19/16 0400  NA 137 140  --  136  K 4.9 4.2  --  4.0  CL 107 103  --  103  CO2 22  --   --  25  GLUCOSE 110* 119*  --  117*  BUN 13 20  --  21*  CREATININE 1.28* 1.20 1.27* 1.38*  CALCIUM 7.9*  --   --  7.9*    PT/INR:  Recent Labs  11/17/16 1442  LABPROT 16.4*  INR 1.31   ABG    Component Value Date/Time   PHART 7.301 (L) 11/17/2016 2117   HCO3 24.2 11/17/2016 2117   TCO2 25 11/18/2016 1639   ACIDBASEDEF 2.0 11/17/2016 2117   O2SAT 97.0 11/17/2016 2117   CBG (last 3)   Recent Labs  11/18/16 0907 11/18/16 1234 11/18/16 2213  GLUCAP 110* 104* 130*    Assessment/Plan: S/P Procedure(s) (LRB): CORONARY  ARTERY BYPASS GRAFTING (CABG)x4   LIMA-LAD DVG-OM SVG-PD SVG-DIAG (N/A) TRANSESOPHAGEAL ECHOCARDIOGRAM (TEE) (N/A) Plan for transfer to step-down: see transfer orders  CV- in SR under pacer. BP a little soft  Metoprolol on hold for bradycardia, low BP  RESP- bibasilar atelectasis- IS, pulmonary hygiene  RENAL- creatinine up slightly, follow  PO lasix  ENDO- CBG well controlled on AC/HS  Ambulate  Enoxaparin for DVT prophylaxis  Anemia secondary to ABL- follow   LOS: 3 days    Loreli SlotSteven C Briteny Fulghum 11/19/2016

## 2016-11-19 NOTE — Progress Notes (Signed)
Progress Note  Patient Name: Dennis Paul Date of Encounter: 11/19/2016  Primary Cardiologist: New  Subjective   Feeling better now. Was weak earlier. "Trying to get breathing in order"  Inpatient Medications    Scheduled Meds: . acetaminophen  1,000 mg Oral Q6H   Or  . acetaminophen (TYLENOL) oral liquid 160 mg/5 mL  1,000 mg Per Tube Q6H  . aspirin EC  325 mg Oral Daily  . atorvastatin  80 mg Oral q1800  . bisacodyl  10 mg Oral Daily   Or  . bisacodyl  10 mg Rectal Daily  . cefUROXime (ZINACEF)  IV  1.5 g Intravenous Q12H  . docusate sodium  200 mg Oral Daily  . enoxaparin (LOVENOX) injection  40 mg Subcutaneous QHS  . furosemide  40 mg Oral Daily  . insulin aspart  0-15 Units Subcutaneous TID WC  . pantoprazole  40 mg Oral Daily  . potassium chloride  20 mEq Oral Daily  . potassium chloride (KCL MULTIRUN) 30 mEq in 265 mL IVPB  30 mEq Intravenous Once  . sodium chloride flush  3 mL Intravenous Q12H   Continuous Infusions: . sodium chloride Stopped (11/18/16 1000)  . sodium chloride    . sodium chloride 10 mL/hr at 11/19/16 0400  . dexmedetomidine Stopped (11/18/16 0310)  . lactated ringers    . lactated ringers 10 mL/hr at 11/19/16 0100  . nitroGLYCERIN Stopped (11/17/16 1611)  . phenylephrine (NEO-SYNEPHRINE) Adult infusion Stopped (11/18/16 1530)   PRN Meds: sodium chloride, lactated ringers, metoprolol, midazolam, morphine injection, ondansetron (ZOFRAN) IV, oxyCODONE, sodium chloride flush, traMADol   Vital Signs    Vitals:   11/19/16 0715 11/19/16 0730 11/19/16 0745 11/19/16 0839  BP:      Pulse: 90 90 78   Resp:      Temp:    98.2 F (36.8 C)  TempSrc:    Oral  SpO2: 94% 95% 95%   Weight:      Height:        Intake/Output Summary (Last 24 hours) at 11/19/16 0916 Last data filed at 11/19/16 0700  Gross per 24 hour  Intake           1082.5 ml  Output              735 ml  Net            347.5 ml   Filed Weights   11/17/16 0300 11/18/16  0445 11/19/16 0615  Weight: 207 lb 12.8 oz (94.3 kg) 215 lb 9.8 oz (97.8 kg) 215 lb 13.3 oz (97.9 kg)    Telemetry    Brief parox atrial tachycardia, no AFIB - Personally Reviewed  ECG     No new ECG- Personally Reviewed  Physical Exam   GEN: No acute distress.  In chair Neck: No JVD Cardiac: RRR, no murmurs, rubs, or gallops. Chest wound dressed Respiratory: Clear to auscultation bilaterally. GI: Soft, nontender, non-distended  MS: No edema; No deformity. Neuro:  Nonfocal  Psych: Normal affect   Labs    Chemistry Recent Labs Lab 11/16/16 1322 11/17/16 0211  11/18/16 0321 11/18/16 1639 11/18/16 1645 11/19/16 0400  NA 138 138  < > 137 140  --  136  K 3.9 4.2  < > 4.9 4.2  --  4.0  CL 107 107  < > 107 103  --  103  CO2 23 22  --  22  --   --  25  GLUCOSE 126* 99  < >  110* 119*  --  117*  BUN 17 12  < > 13 20  --  21*  CREATININE 1.25* 1.18  < > 1.28* 1.20 1.27* 1.38*  CALCIUM 8.5* 8.8*  --  7.9*  --   --  7.9*  PROT 6.5  --   --   --   --   --   --   ALBUMIN 3.9  --   --   --   --   --   --   AST 35  --   --   --   --   --   --   ALT 25  --   --   --   --   --   --   ALKPHOS 75  --   --   --   --   --   --   BILITOT 0.8  --   --   --   --   --   --   GFRNONAA 58* >60  < > 57*  --  57* 52*  GFRAA >60 >60  < > >60  --  >60 60*  ANIONGAP 8 9  --  8  --   --  8  < > = values in this interval not displayed.   Hematology Recent Labs Lab 11/18/16 0321 11/18/16 1639 11/18/16 1645 11/19/16 0400  WBC 9.0  --  10.5 9.8  RBC 2.86*  --  2.71* 2.64*  HGB 8.3* 8.2* 8.0* 7.7*  HCT 25.0* 24.0* 23.8* 23.7*  MCV 87.4  --  87.8 89.8  MCH 29.0  --  29.5 29.2  MCHC 33.2  --  33.6 32.5  RDW 13.0  --  13.3 13.6  PLT 124*  --  118* 126*    Cardiac Enzymes Recent Labs Lab 11/16/16 1322 11/16/16 1559 11/16/16 2047 11/17/16 0211  TROPONINI 1.31* 8.89* 23.58* 16.03*   No results for input(s): TROPIPOC in the last 168 hours.   BNPNo results for input(s): BNP, PROBNP  in the last 168 hours.   DDimer No results for input(s): DDIMER in the last 168 hours.   Radiology    Dg Chest Port 1 View  Result Date: 11/19/2016 CLINICAL DATA:  Atelectasis.  Coronary artery disease. EXAM: PORTABLE CHEST 1 VIEW COMPARISON:  11/18/2016. FINDINGS: Cardiomegaly. Prior CABG. Swan-Ganz catheter has been removed. Low lung volumes with increasing atelectasis at the bases. Removal of BILATERAL chest tubes and mediastinal tubes without visible pneumothorax. Swan-Ganz introducer remains but is kinked in the neck. IMPRESSION: Removal of chest tubes and mediastinal tubes with increasing bibasilar atelectasis, and low lung volumes. No pneumothorax is evident.  Swan-Ganz catheter has been removed. Cardiomegaly. Electronically Signed   By: Elsie Stain M.D.   On: 11/19/2016 06:59   Dg Chest Port 1 View  Result Date: 11/18/2016 CLINICAL DATA:  Follow-up. EXAM: PORTABLE CHEST 1 VIEW COMPARISON:  11/17/2016 FINDINGS: Interval extubation and removal of NG tube. Bilateral chest tubes remain in place. No pneumothorax. Swan-Ganz catheter remains in the main pulmonary artery. Low lung volumes with increasing bibasilar atelectasis. Mild vascular congestion. IMPRESSION: Interval extubation. Worsening aeration in the bases with bibasilar atelectasis. Mild vascular congestion. No pneumothorax. Electronically Signed   By: Charlett Nose M.D.   On: 11/18/2016 07:25   Dg Chest Port 1 View  Result Date: 11/17/2016 CLINICAL DATA:  CABG. EXAM: PORTABLE CHEST 1 VIEW COMPARISON:  One-view chest x-ray 11/26/2016 FINDINGS: Heart is enlarged. The patient is now status post median sternotomy  for CABG. A Swan-Ganz catheter enters via a right IJ sheath terminates in the main pulmonary artery outflow. Endotracheal tube is an satisfactory position 5 cm above the carina. The side port of the NG tube is at the GE junction and could be advanced for more optimal positioning. Bilateral chest tubes and mediastinal drain are in  place. There is no pneumothorax or pneumomediastinum. The lung volumes are low without significant focal airspace consolidation. Six sternal wires are intact. IMPRESSION: 1. Status post CABG without radiographic evidence for complication. 2. Support apparatus in satisfactory position apart from NG tube which could be advanced as described. Electronically Signed   By: Marin Robertshristopher  Mattern M.D.   On: 11/17/2016 14:54    Cardiac Studies   Cath 11/16/16   Mid RCA lesion, 70 %stenosed.  RPDA lesion, 95 %stenosed.  Prox Cx to Mid Cx lesion, 50 %stenosed.  Prox LAD lesion, 90 %stenosed.  Mid LAD lesion, 50 %stenosed.  Ost 2nd Diag lesion, 90 %stenosed.  There is mild to moderate left ventricular systolic dysfunction.  LV end diastolic pressure is normal.   1. Severe 3 vessel obstructive CAD 2. Mild to moderate LV dysfunction 3. 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 The patient is now pain free and ST elevation is improved. He has complex 3 vessel disease with ulcerated plaque in the proximal LAD and bifurcation disease at the take off of a large diagonal branch. Given the complexity of disease I think he would be best managed with CABG. Will maximize his medical therapy and consult CT surgery.   TEE 11/17/26  Left ventricle: Normal cavity size and left ventricular diastolic function. LV systolic function is mildly reduced with an EF of 45-50%. Wall motion is abnormal. Anterior wall motion is hypokinetic.  Septum: No Patent Foramen Ovale present.  Left atrium: Patent foramen ovale not present.  Aortic valve: No stenosis. Trace regurgitation.  Mitral valve: No leaflet thickening and calcification present. Trace regurgitation.  Right ventricle: Normal cavity size, wall thickness and ejection fraction.  No significant valve abnormalities. No significant valve abnormalities. there is no significant chamber abnormalities.  CABG 11/17/16:              LIMA-LAD             RIMA-PD             SVG-OM             SVG-DIAG  Patient Profile     66 y.o. male STEMI to CABG  Assessment & Plan    Parox atrial tachy  - metoprolol currently on hold for hypotension. Add when able.   - no AFIB  CABG/CAD  - improving  STEMI INFERIOR  - EF 45-50%  - Add Bb and ACE-I when able   - ASA  - statin  - Consider Plavix for one year  Will follow  Signed, Donato SchultzMark Mirissa Lopresti, MD  11/19/2016, 9:16 AM

## 2016-11-19 NOTE — Progress Notes (Signed)
afib 170 noted on bedside ekg.  Pt asymptomatic.  BP 127/74.  Metoprolol 5mg  IV given.  Dr. Dorris FetchHendrickson notified.  Orders received.  Will continue to monitor closely.

## 2016-11-20 ENCOUNTER — Encounter (HOSPITAL_COMMUNITY): Payer: Self-pay | Admitting: Thoracic Surgery (Cardiothoracic Vascular Surgery)

## 2016-11-20 DIAGNOSIS — R600 Localized edema: Secondary | ICD-10-CM

## 2016-11-20 LAB — BASIC METABOLIC PANEL
ANION GAP: 7 (ref 5–15)
BUN: 20 mg/dL (ref 6–20)
CHLORIDE: 103 mmol/L (ref 101–111)
CO2: 26 mmol/L (ref 22–32)
Calcium: 7.9 mg/dL — ABNORMAL LOW (ref 8.9–10.3)
Creatinine, Ser: 1.19 mg/dL (ref 0.61–1.24)
GFR calc non Af Amer: >60 mL/min (ref >60)
GLUCOSE: 104 mg/dL — AB (ref 65–99)
Potassium: 4.2 mmol/L (ref 3.5–5.1)
Sodium: 136 mmol/L (ref 135–145)

## 2016-11-20 LAB — CBC
HEMATOCRIT: 22.1 % — AB (ref 39.0–52.0)
HEMOGLOBIN: 7.3 g/dL — AB (ref 13.0–17.0)
MCH: 29.6 pg (ref 26.0–34.0)
MCHC: 33 g/dL (ref 30.0–36.0)
MCV: 89.5 fL (ref 78.0–100.0)
Platelets: 124 10*3/uL — ABNORMAL LOW (ref 150–400)
RBC: 2.47 MIL/uL — ABNORMAL LOW (ref 4.22–5.81)
RDW: 13.6 % (ref 11.5–15.5)
WBC: 8.2 10*3/uL (ref 4.0–10.5)

## 2016-11-20 LAB — GLUCOSE, CAPILLARY
GLUCOSE-CAPILLARY: 95 mg/dL (ref 65–99)
GLUCOSE-CAPILLARY: 97 mg/dL (ref 65–99)
Glucose-Capillary: 89 mg/dL (ref 65–99)
Glucose-Capillary: 95 mg/dL (ref 65–99)

## 2016-11-20 MED ORDER — SODIUM CHLORIDE 0.9 % IV SOLN: 250.0000 mL | INTRAVENOUS | Status: DC | PRN

## 2016-11-20 MED ORDER — MOVING RIGHT ALONG BOOK
Freq: Once | Status: DC
  Filled 2016-11-20: qty 1

## 2016-11-20 MED ORDER — SODIUM CHLORIDE 0.9% FLUSH
3.0000 mL | INTRAVENOUS | Status: DC | PRN
  Administered 2016-11-20: 3 mL via INTRAVENOUS
  Filled 2016-11-20: qty 3

## 2016-11-20 MED ORDER — AMIODARONE HCL 200 MG PO TABS
400.0000 mg | ORAL_TABLET | Freq: Two times a day (BID) | ORAL | Status: DC
  Administered 2016-11-20 – 2016-11-23 (×8): 400 mg via ORAL
  Filled 2016-11-20 (×9): qty 2

## 2016-11-20 MED ORDER — ALUM & MAG HYDROXIDE-SIMETH 200-200-20 MG/5ML PO SUSP: 15.0000 mL | ORAL | Status: DC | PRN

## 2016-11-20 MED ORDER — TAMSULOSIN HCL 0.4 MG PO CAPS
0.4000 mg | ORAL_CAPSULE | Freq: Every day | ORAL | Status: DC
  Administered 2016-11-20 – 2016-11-24 (×5): 0.4 mg via ORAL
  Filled 2016-11-20 (×5): qty 1

## 2016-11-20 MED ORDER — MAGNESIUM HYDROXIDE 400 MG/5ML PO SUSP: 30.0000 mL | Freq: Every day | ORAL | Status: DC | PRN

## 2016-11-20 MED ORDER — SODIUM CHLORIDE 0.9% FLUSH
3.0000 mL | Freq: Two times a day (BID) | INTRAVENOUS | Status: DC
  Administered 2016-11-20: 10 mL via INTRAVENOUS
  Administered 2016-11-21 – 2016-11-24 (×5): 3 mL via INTRAVENOUS

## 2016-11-20 MED ORDER — ZOLPIDEM TARTRATE 5 MG PO TABS: 5.0000 mg | ORAL_TABLET | Freq: Every evening | ORAL | Status: DC | PRN

## 2016-11-20 NOTE — Plan of Care (Signed)
Problem: Cardiac: Goal: Hemodynamic stability will improve Outcome: Progressing Pt in and out of afib today  Problem: Nutritional: Goal: Risk for body nutrition deficit will decrease Outcome: Not Progressing Pt with poor appetite currently

## 2016-11-20 NOTE — Progress Notes (Signed)
Progress Note  Patient Name: Dennis KaysJeremiah Paul Date of Encounter: 11/20/2016  Primary Cardiologist: new ([place])  Subjective   No chest pain or SOB  Inpatient Medications    Scheduled Meds: . acetaminophen  1,000 mg Oral Q6H   Or  . acetaminophen (TYLENOL) oral liquid 160 mg/5 mL  1,000 mg Per Tube Q6H  . amiodarone  400 mg Oral BID  . aspirin EC  325 mg Oral Daily  . atorvastatin  80 mg Oral q1800  . bisacodyl  10 mg Oral Daily   Or  . bisacodyl  10 mg Rectal Daily  . docusate sodium  200 mg Oral Daily  . enoxaparin (LOVENOX) injection  40 mg Subcutaneous QHS  . furosemide  40 mg Oral Daily  . insulin aspart  0-15 Units Subcutaneous TID WC  . metoprolol tartrate  12.5 mg Oral BID  . pantoprazole  40 mg Oral Daily  . potassium chloride  20 mEq Oral Daily  . potassium chloride (KCL MULTIRUN) 30 mEq in 265 mL IVPB  30 mEq Intravenous Once  . sodium chloride flush  3 mL Intravenous Q12H  . tamsulosin  0.4 mg Oral Daily   Continuous Infusions: . sodium chloride Stopped (11/18/16 1000)  . sodium chloride    . sodium chloride Stopped (11/20/16 0900)  . amiodarone Stopped (11/20/16 0900)  . dexmedetomidine Stopped (11/18/16 0310)  . lactated ringers Stopped (11/20/16 0700)  . lactated ringers 10 mL/hr at 11/19/16 0100  . nitroGLYCERIN Stopped (11/17/16 1611)  . phenylephrine (NEO-SYNEPHRINE) Adult infusion Stopped (11/18/16 1530)   PRN Meds: sodium chloride, lactated ringers, metoprolol, midazolam, morphine injection, ondansetron (ZOFRAN) IV, oxyCODONE, sodium chloride flush, traMADol   Vital Signs    Vitals:   11/20/16 0800 11/20/16 0900 11/20/16 1000 11/20/16 1100  BP: 103/65 114/75 112/73 102/71  Pulse: 71 75 72 61  Resp:      Temp:      TempSrc:      SpO2: 98% 100% 97% 93%  Weight:      Height:        Intake/Output Summary (Last 24 hours) at 11/20/16 1142 Last data filed at 11/20/16 1100  Gross per 24 hour  Intake              987 ml  Output              1550 ml  Net             -563 ml   Filed Weights   11/18/16 0445 11/19/16 0615 11/20/16 0600  Weight: 215 lb 9.8 oz (97.8 kg) 215 lb 13.3 oz (97.9 kg) 215 lb 13.3 oz (97.9 kg)    Telemetry    PAF at times, currently SR - Personally Reviewed  ECG    11/18/16 Sinus brady 45, none since, HR now imrpoved  - Personally Reviewed  Physical Exam   GEN: No acute distress.   Neck: No JVD Cardiac: RRR, no murmurs, rubs, or gallops. Well healing scar in the mid chest Respiratory: Clear to auscultation bilaterally. ant GI: Soft, nontender, non-distended  MS: 1+ edema of lower ext; No deformity. Neuro:  Nonfocal  Psych: Normal affect   Labs    Chemistry Recent Labs Lab 11/16/16 1322  11/18/16 0321 11/18/16 1639 11/18/16 1645 11/19/16 0400 11/20/16 0515  NA 138  < > 137 140  --  136 136  K 3.9  < > 4.9 4.2  --  4.0 4.2  CL 107  < > 107  103  --  103 103  CO2 23  < > 22  --   --  25 26  GLUCOSE 126*  < > 110* 119*  --  117* 104*  BUN 17  < > 13 20  --  21* 20  CREATININE 1.25*  < > 1.28* 1.20 1.27* 1.38* 1.19  CALCIUM 8.5*  < > 7.9*  --   --  7.9* 7.9*  PROT 6.5  --   --   --   --   --   --   ALBUMIN 3.9  --   --   --   --   --   --   AST 35  --   --   --   --   --   --   ALT 25  --   --   --   --   --   --   ALKPHOS 75  --   --   --   --   --   --   BILITOT 0.8  --   --   --   --   --   --   GFRNONAA 58*  < > 57*  --  57* 52* >60  GFRAA >60  < > >60  --  >60 60* >60  ANIONGAP 8  < > 8  --   --  8 7  < > = values in this interval not displayed.   Hematology Recent Labs Lab 11/18/16 1645 11/19/16 0400 11/20/16 0515  WBC 10.5 9.8 8.2  RBC 2.71* 2.64* 2.47*  HGB 8.0* 7.7* 7.3*  HCT 23.8* 23.7* 22.1*  MCV 87.8 89.8 89.5  MCH 29.5 29.2 29.6  MCHC 33.6 32.5 33.0  RDW 13.3 13.6 13.6  PLT 118* 126* 124*    Cardiac Enzymes Recent Labs Lab 11/16/16 1322 11/16/16 1559 11/16/16 2047 11/17/16 0211  TROPONINI 1.31* 8.89* 23.58* 16.03*   No results for input(s):  TROPIPOC in the last 168 hours.   BNPNo results for input(s): BNP, PROBNP in the last 168 hours.   DDimer No results for input(s): DDIMER in the last 168 hours.   Radiology    Dg Chest Port 1 View  Result Date: 11/19/2016 CLINICAL DATA:  Atelectasis.  Coronary artery disease. EXAM: PORTABLE CHEST 1 VIEW COMPARISON:  11/18/2016. FINDINGS: Cardiomegaly. Prior CABG. Swan-Ganz catheter has been removed. Low lung volumes with increasing atelectasis at the bases. Removal of BILATERAL chest tubes and mediastinal tubes without visible pneumothorax. Swan-Ganz introducer remains but is kinked in the neck. IMPRESSION: Removal of chest tubes and mediastinal tubes with increasing bibasilar atelectasis, and low lung volumes. No pneumothorax is evident.  Swan-Ganz catheter has been removed. Cardiomegaly. Electronically Signed   By: Elsie Stain M.D.   On: 11/19/2016 06:59    Cardiac Studies   11/16/16  Cardiac cath - STEMI Conclusion     Mid RCA lesion, 70 %stenosed.  RPDA lesion, 95 %stenosed.  Prox Cx to Mid Cx lesion, 50 %stenosed.  Prox LAD lesion, 90 %stenosed.  Mid LAD lesion, 50 %stenosed.  Ost 2nd Diag lesion, 90 %stenosed.  There is mild to moderate left ventricular systolic dysfunction.  LV end diastolic pressure is normal.   1. Severe 3 vessel obstructive CAD 2. Mild to moderate LV dysfunction 3. 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 The patient is now pain free and ST elevation is improved. He has complex 3 vessel disease with ulcerated plaque  in the proximal LAD and bifurcation disease at the take off of a large diagonal branch. Given the complexity of disease I think he would be best managed with CABG. Will maximize his medical therapy and consult CT surgery.     TEE intraop Conclusions   Result status: Final result   Left ventricle: Normal cavity size and left ventricular diastolic function. LV systolic function is mildly reduced  with an EF of 45-50%. Wall motion is abnormal. Anterior wall motion is hypokinetic.  Septum: No Patent Foramen Ovale present.  Left atrium: Patent foramen ovale not present.  Aortic valve: No stenosis. Trace regurgitation.  Mitral valve: No leaflet thickening and calcification present. Trace regurgitation.  Right ventricle: Normal cavity size, wall thickness and ejection fraction.  No significant valve abnormalities. No significant valve abnormalities. there is no significant chamber abnormalities.     CABG 11/17/16  PROCEDURE:  Procedure(s): CORONARY ARTERY BYPASS GRAFTING (CABG) (N/A) TRANSESOPHAGEAL ECHOCARDIOGRAM (TEE) (N/A)             LIMA-LAD             RIMA-PD             SVG-OM             SVG-DIAG  Patient Profile     66 y.o. male with no prior cardiac his, recent URI admitted with inf. Wall STEMI 11/16/16 and transferred from [facility] ER to [facility] for emergent cath, which revealed severe 3 vessel obstructive CAD.  Underwent CABG X 4 11/17/16.  Today PAF.      Assessment & Plan    1. STEMI with severe 3 vessel disease, pk troponin 23.58.  ? Add plavix  2. CAD with CABG 11/17/16 X 4-- post op day 2  3.  LV dysfunction with EF 45-50% on TEE intraop.   4. Carotid disease 1-39% bil. Rt and lt carotid disease.  5. HLD  With LDL of 135 on admit, HDL of 31 TG 115, on atorvastatin 80  6. PAF on IV amiodarone now po and now in SR beginning low dose BB.  On ASA  7. Post op volume overload wt up 8 lbs since admit, on lasix and diuresing Negative 1,928.   Signed, Nada Boozer, NP  11/20/2016, 11:42 AM    I have examined the patient and reviewed assessment and plan and discussed with patient.  Agree with above as stated.  Since he is post MI, agree with adding Plavix for additional medical therapy once he is further out from his CABG.  Ultimately, he will follow up in the [place] office for his f/u cardiac care.    I expect Amio to be a medicine he needs for 2-3 months for AFib  post-op.  Lance Muss

## 2016-11-20 NOTE — Progress Notes (Signed)
CT surgery p.m. Rounds  Patient examined and record reviewed.Hemodynamics stable,labs satisfactory.Patient had stable day.Continue current care. Kathlee Nationseter Van Trigt III 11/20/2016

## 2016-11-21 ENCOUNTER — Inpatient Hospital Stay (HOSPITAL_COMMUNITY): Payer: Medicare Other

## 2016-11-21 LAB — CBC
HCT: 23.6 % — ABNORMAL LOW (ref 39.0–52.0)
Hemoglobin: 7.7 g/dL — ABNORMAL LOW (ref 13.0–17.0)
MCH: 29.4 pg (ref 26.0–34.0)
MCHC: 32.6 g/dL (ref 30.0–36.0)
MCV: 90.1 fL (ref 78.0–100.0)
PLATELETS: 168 10*3/uL (ref 150–400)
RBC: 2.62 MIL/uL — AB (ref 4.22–5.81)
RDW: 13.5 % (ref 11.5–15.5)
WBC: 8.2 10*3/uL (ref 4.0–10.5)

## 2016-11-21 LAB — GLUCOSE, CAPILLARY
GLUCOSE-CAPILLARY: 131 mg/dL — AB (ref 65–99)
Glucose-Capillary: 77 mg/dL (ref 65–99)

## 2016-11-21 LAB — BASIC METABOLIC PANEL
Anion gap: 9 (ref 5–15)
BUN: 20 mg/dL (ref 6–20)
CALCIUM: 8.1 mg/dL — AB (ref 8.9–10.3)
CO2: 27 mmol/L (ref 22–32)
Chloride: 102 mmol/L (ref 101–111)
Creatinine, Ser: 1.3 mg/dL — ABNORMAL HIGH (ref 0.61–1.24)
GFR calc Af Amer: >60 mL/min (ref >60)
GFR, EST NON AFRICAN AMERICAN: 56 mL/min — AB (ref >60)
GLUCOSE: 98 mg/dL (ref 65–99)
POTASSIUM: 4.4 mmol/L (ref 3.5–5.1)
SODIUM: 138 mmol/L (ref 135–145)

## 2016-11-21 MED ORDER — AMIODARONE IV BOLUS ONLY 150 MG/100ML
150.0000 mg | Freq: Once | INTRAVENOUS | Status: AC
  Administered 2016-11-21: 150 mg via INTRAVENOUS
  Filled 2016-11-21: qty 100

## 2016-11-21 MED ORDER — METOPROLOL TARTRATE 25 MG PO TABS
25.0000 mg | ORAL_TABLET | Freq: Two times a day (BID) | ORAL | Status: DC
  Administered 2016-11-21 – 2016-11-24 (×7): 25 mg via ORAL
  Filled 2016-11-21 (×7): qty 1

## 2016-11-21 MED ORDER — COUMADIN BOOK
Freq: Once | Status: AC
  Administered 2016-11-21: 1
  Filled 2016-11-21: qty 1

## 2016-11-21 MED ORDER — AMIODARONE IV BOLUS ONLY 150 MG/100ML
150.0000 mg | Freq: Once | INTRAVENOUS | Status: DC
  Filled 2016-11-21: qty 100

## 2016-11-21 MED ORDER — WARFARIN - PHYSICIAN DOSING INPATIENT: Freq: Every day | Status: DC

## 2016-11-21 MED ORDER — WARFARIN SODIUM 5 MG PO TABS
5.0000 mg | ORAL_TABLET | Freq: Every day | ORAL | Status: DC
  Administered 2016-11-21 – 2016-11-23 (×3): 5 mg via ORAL
  Filled 2016-11-21 (×3): qty 1

## 2016-11-21 MED ORDER — ASPIRIN EC 81 MG PO TBEC
81.0000 mg | DELAYED_RELEASE_TABLET | Freq: Every day | ORAL | Status: DC
  Administered 2016-11-21 – 2016-11-24 (×4): 81 mg via ORAL
  Filled 2016-11-21 (×4): qty 1

## 2016-11-21 MED FILL — Sodium Bicarbonate IV Soln 8.4%: INTRAVENOUS | Qty: 50 | Status: AC

## 2016-11-21 MED FILL — Heparin Sodium (Porcine) Inj 1000 Unit/ML: INTRAMUSCULAR | Qty: 10 | Status: AC

## 2016-11-21 MED FILL — Sodium Chloride IV Soln 0.9%: INTRAVENOUS | Qty: 2000 | Status: AC

## 2016-11-21 MED FILL — Mannitol IV Soln 20%: INTRAVENOUS | Qty: 500 | Status: AC

## 2016-11-21 MED FILL — Electrolyte-R (PH 7.4) Solution: INTRAVENOUS | Qty: 4000 | Status: AC

## 2016-11-21 MED FILL — Lidocaine HCl IV Inj 20 MG/ML: INTRAVENOUS | Qty: 5 | Status: AC

## 2016-11-21 NOTE — Progress Notes (Signed)
Patient has not voided. Stated that he is very sore after his bladder was pushed and the second foley was removed today.is very nervous about another foley being put in.   Bladder scan showed 554ml. Spoke with Dr Laneta SimmersBartle.  Patient is not having any discomfort from the bladder at this time. Will continue to monitor and see if patient is able to urinate sometime tonight.  Advised patient to try and relax. Call light within reach

## 2016-11-21 NOTE — Progress Notes (Signed)
4 Days Post-Op Procedure(s) (LRB): CORONARY ARTERY BYPASS GRAFTING (CABG)x4   LIMA-LAD DVG-OM SVG-PD SVG-DIAG (N/A) TRANSESOPHAGEAL ECHOCARDIOGRAM (TEE) (N/A) Subjective: In and out of atrial fib overnight. Some left sided CP last night, better this AM  Objective: Vital signs in last 24 hours: Temp:  [97.5 F (36.4 C)-99 F (37.2 C)] 97.5 F (36.4 C) (02/13 0400) Pulse Rate:  [56-106] 90 (02/13 0400) Cardiac Rhythm: Atrial fibrillation (02/13 0400) BP: (94-114)/(65-75) 99/66 (02/13 0400) SpO2:  [90 %-100 %] 91 % (02/13 0400) Weight:  [211 lb 13.8 oz (96.1 kg)] 211 lb 13.8 oz (96.1 kg) (02/13 0600)  Hemodynamic parameters for last 24 hours:    Intake/Output from previous day: 02/12 0701 - 02/13 0700 In: 549.7 [P.O.:520; I.V.:29.7] Out: 2200 [Urine:2200] Intake/Output this shift: No intake/output data recorded.  General appearance: alert, cooperative and no distress Neurologic: intact Heart: regular rate and rhythm Lungs: diminished breath sounds bibasilar Abdomen: normal findings: soft, non-tender  Lab Results:  Recent Labs  11/20/16 0515 11/21/16 0243  WBC 8.2 8.2  HGB 7.3* 7.7*  HCT 22.1* 23.6*  PLT 124* 168   BMET:  Recent Labs  11/20/16 0515 11/21/16 0243  NA 136 138  K 4.2 4.4  CL 103 102  CO2 26 27  GLUCOSE 104* 98  BUN 20 20  CREATININE 1.19 1.30*  CALCIUM 7.9* 8.1*    PT/INR: No results for input(s): LABPROT, INR in the last 72 hours. ABG    Component Value Date/Time   PHART 7.301 (L) 11/17/2016 2117   HCO3 24.2 11/17/2016 2117   TCO2 25 11/18/2016 1639   ACIDBASEDEF 2.0 11/17/2016 2117   O2SAT 97.0 11/17/2016 2117   CBG (last 3)   Recent Labs  11/20/16 1209 11/20/16 1626 11/20/16 2121  GLUCAP 95 95 97    Assessment/Plan: S/P Procedure(s) (LRB): CORONARY ARTERY BYPASS GRAFTING (CABG)x4   LIMA-LAD DVG-OM SVG-PD SVG-DIAG (N/A) TRANSESOPHAGEAL ECHOCARDIOGRAM (TEE) (N/A) Plan for transfer to step-down: see transfer orders   Awaiting bed on 2 [address] CV- in and out of atrial fib- will rebolus amiodarone, continue PO, increase lopressor  Will anticoagulate with coumadin- 5 mg tonight  RESP- improving atelectasis  RENAL- creatinine up slightly, will follow  Continue PO lasix  ENDO- CBG normal- dc CBG/ SSI  Continue cardiac rehab   LOS: 5 days    Dennis Paul 11/21/2016

## 2016-11-21 NOTE — Progress Notes (Signed)
Patient sitting up in chair, no needs at this time. Concerned about the afib.  Advised him that he is back in normal sinus and that we have some medication on standby if needed. No other needs at this time, call light within reach

## 2016-11-22 LAB — PROTIME-INR
INR: 1.05
PROTHROMBIN TIME: 13.7 s (ref 11.4–15.2)

## 2016-11-22 NOTE — Discharge Instructions (Addendum)
Atrial Fibrillation Introduction Atrial fibrillation is a type of heartbeat that is irregular or fast (rapid). If you have this condition, your heart keeps quivering in a weird (chaotic) way. This condition can make it so your heart cannot pump blood normally. Having this condition gives a person more risk for stroke, heart failure, and other heart problems. There are different types of atrial fibrillation. Talk with your doctor to learn about the type that you have. Follow these instructions at home:  Take over-the-counter and prescription medicines only as told by your doctor.  If your doctor prescribed a blood-thinning medicine, take it exactly as told. Taking too much of it can cause bleeding. If you do not take enough of it, you will not have the protection that you need against stroke and other problems.  Do not use any tobacco products. These include cigarettes, chewing tobacco, and e-cigarettes. If you need help quitting, ask your doctor.  If you have apnea (obstructive sleep apnea), manage it as told by your doctor.  Do not drink alcohol.  Do not drink beverages that have caffeine. These include coffee, soda, and tea.  Maintain a healthy weight. Do not use diet pills unless your doctor says they are safe for you. Diet pills may make heart problems worse.  Follow diet instructions as told by your doctor.  Exercise regularly as told by your doctor.  Keep all follow-up visits as told by your doctor. This is important. Contact a doctor if:  You notice a change in the speed, rhythm, or strength of your heartbeat.  You are taking a blood-thinning medicine and you notice more bruising.  You get tired more easily when you move or exercise. Get help right away if:  You have pain in your chest or your belly (abdomen).  You have sweating or weakness.  You feel sick to your stomach (nauseous).  You notice blood in your throw up (vomit), poop (stool), or pee (urine).  You are  short of breath.  You suddenly have swollen feet and ankles.  You feel dizzy.  Your suddenly get weak or numb in your face, arms, or legs, especially if it happens on one side of your body.  You have trouble talking, trouble understanding, or both.  Your face or your eyelid droops on one side. These symptoms may be an emergency. Do not wait to see if the symptoms will go away. Get medical help right away. Call your local emergency services (911 in the [place] Do not drive yourself to the hospital.  This information is not intended to replace advice given to you by your health care provider. Make sure you discuss any questions you have with your health care provider. Document Released: 07/04/2008 Document Revised: 03/02/2016 Document Reviewed: 01/20/2015  2017 Elsevier Coronary Artery Bypass Grafting, Care After Refer to this sheet in the next few weeks. These instructions provide you with information on caring for yourself after your procedure. Your health care provider may also give you more specific instructions. Your treatment has been planned according to current medical practices, but problems sometimes occur. Call your health care provider if you have any problems or questions after your procedure. WHAT TO EXPECT AFTER THE PROCEDURE Recovery from surgery will be different for everyone. Some people feel well after 3 or 4 weeks, while for others it takes longer. After your procedure, it is typical to have the following:  Nausea and a lack of appetite.   Constipation.  Weakness and fatigue.   Depression or  irritability.   Pain or discomfort at your incision site. HOME CARE INSTRUCTIONS  Take medicines only as directed by your health care provider. Do not stop taking medicines or start any new medicines without first checking with your health care provider.  Take your pulse as directed by your health care provider.  Perform deep breathing as directed by your health care  provider. If you were given a device called an incentive spirometer, use it to practice deep breathing several times a day. Support your chest with a pillow or your arms when you take deep breaths or cough.  Keep incision areas clean, dry, and protected. Remove or change any bandages (dressings) only as directed by your health care provider. You may have skin adhesive strips over the incision areas. Do not take the strips off. They will fall off on their own.  Check incision areas daily for any swelling, redness, or drainage.  If incisions were made in your legs, do the following:  Avoid crossing your legs.   Avoid sitting for long periods of time. Change positions every 30 minutes.   Elevate your legs when you are sitting.  Wear compression stockings as directed by your health care provider. These stockings help keep blood clots from forming in your legs.  Take showers once your health care provider approves. Until then, only take sponge baths. Pat incisions dry. Do not rub incisions with a washcloth or towel. Do not take baths, swim, or use a hot tub until your health care provider approves.  Eat foods that are high in fiber, such as raw fruits and vegetables, whole grains, beans, and nuts. Meats should be lean cut. Avoid canned, processed, and fried foods.  Drink enough fluid to keep your urine clear or pale yellow.  Weigh yourself every day. This helps identify if you are retaining fluid that may make your heart and lungs work harder.  Rest and limit activity as directed by your health care provider. You may be instructed to:  Stop any activity at once if you have chest pain, shortness of breath, irregular heartbeats, or dizziness. Get help right away if you have any of these symptoms.  Move around frequently for short periods or take short walks as directed by your health care provider. Increase your activities gradually. You may need physical therapy or cardiac rehabilitation to  help strengthen your muscles and build your endurance.  Avoid lifting, pushing, or pulling anything heavier than 10 lb (4.5 kg) for at least 6 weeks after surgery.  Do not drive until your health care provider approves.  Ask your health care provider when you may return to work.  Ask your health care provider when you may resume sexual activity.  Keep all follow-up visits as directed by your health care provider. This is important. SEEK MEDICAL CARE IF:  You have swelling, redness, increasing pain, or drainage at the site of an incision.  You have a fever.  You have swelling in your ankles or legs.  You have pain in your legs.   You gain 2 or more pounds (0.9 kg) a day.  You are nauseous or vomit.  You have diarrhea. SEEK IMMEDIATE MEDICAL CARE IF:  You have chest pain that goes to your jaw or arms.  You have shortness of breath.   You have a fast or irregular heartbeat.   You notice a "clicking" in your breastbone (sternum) when you move.   You have numbness or weakness in your arms or legs.  You feel dizzy or light-headed.  MAKE SURE YOU:  Understand these instructions.  Will watch your condition.  Will get help right away if you are not doing well or get worse. This information is not intended to replace advice given to you by your health care provider. Make sure you discuss any questions you have with your health care provider. Document Released: 04/14/2005 Document Revised: 10/16/2014 Document Reviewed: 03/04/2013 Elsevier Interactive Patient Education  2017 [employer].      Information on my medicine - Coumadin   (Warfarin)  This medication education was reviewed with me or my healthcare representative as part of my discharge preparation.  The pharmacist that spoke with me during my hospital stay was:  Lennon Alstrom, [facility] [address]  Why was Coumadin prescribed for you? Coumadin was prescribed for you because you have a blood clot or a medical  condition that can cause an increased risk of forming blood clots. Blood clots can cause serious health problems by blocking the flow of blood to the heart, lung, or brain. Coumadin can prevent harmful blood clots from forming. As a reminder your indication for Coumadin is:   Stroke Prevention Because Of Atrial Fibrillation  What test will check on my response to Coumadin? While on Coumadin (warfarin) you will need to have an INR test regularly to ensure that your dose is keeping you in the desired range. The INR (international normalized ratio) number is calculated from the result of the laboratory test called prothrombin time (PT).  If an INR APPOINTMENT HAS NOT ALREADY BEEN MADE FOR YOU please schedule an appointment to have this lab work done by your health care provider within 7 days. Your INR goal is usually a number between:  2 to 3 or your provider may give you a more narrow range like 2-2.5.  Ask your health care provider during an office visit what your goal INR is.  What  do you need to  know  About  COUMADIN? Take Coumadin (warfarin) exactly as prescribed by your healthcare provider about the same time each day.  DO NOT stop taking without talking to the doctor who prescribed the medication.  Stopping without other blood clot prevention medication to take the place of Coumadin may increase your risk of developing a new clot or stroke.  Get refills before you run out.  What do you do if you miss a dose? If you miss a dose, take it as soon as you remember on the same day then continue your regularly scheduled regimen the next day.  Do not take two doses of Coumadin at the same time.  Important Safety Information A possible side effect of Coumadin (Warfarin) is an increased risk of bleeding. You should call your healthcare provider right away if you experience any of the following: ? Bleeding from an injury or your nose that does not stop. ? Unusual colored urine (red or dark brown) or  unusual colored stools (red or black). ? Unusual bruising for unknown reasons. ? A serious fall or if you hit your head (even if there is no bleeding).  Some foods or medicines interact with Coumadin (warfarin) and might alter your response to warfarin. To help avoid this: ? Eat a balanced diet, maintaining a consistent amount of Vitamin K. ? Notify your provider about major diet changes you plan to make. ? Avoid alcohol or limit your intake to 1 drink for women and 2 drinks for men per day. (1 drink is 5 oz. wine,  12 oz. beer, or 1.5 oz. liquor.)  Make sure that ANY health care provider who prescribes medication for you knows that you are taking Coumadin (warfarin).  Also make sure the healthcare provider who is monitoring your Coumadin knows when you have started a new medication including herbals and non-prescription products.  Coumadin (Warfarin)  Major Drug Interactions  Increased Warfarin Effect Decreased Warfarin Effect  Alcohol (large quantities) Antibiotics (esp. Septra/Bactrim, Flagyl, Cipro) Amiodarone (Cordarone) Aspirin (ASA) Cimetidine (Tagamet) Megestrol (Megace) NSAIDs (ibuprofen, naproxen, etc.) Piroxicam (Feldene) Propafenone (Rythmol SR) Propranolol (Inderal) Isoniazid (INH) Posaconazole (Noxafil) Barbiturates (Phenobarbital) Carbamazepine (Tegretol) Chlordiazepoxide (Librium) Cholestyramine (Questran) Griseofulvin Oral Contraceptives Rifampin Sucralfate (Carafate) Vitamin K   Coumadin (Warfarin) Major Herbal Interactions  Increased Warfarin Effect Decreased Warfarin Effect  Garlic Ginseng Ginkgo biloba Coenzyme Q10 Green tea St. Johns wort    Coumadin (Warfarin) FOOD Interactions  Eat a consistent number of servings per week of foods HIGH in Vitamin K (1 serving =  cup)  Collards (cooked, or boiled & drained) Kale (cooked, or boiled & drained) Mustard greens (cooked, or boiled & drained) Parsley *serving size only =  cup Spinach (cooked, or  boiled & drained) Swiss chard (cooked, or boiled & drained) Turnip greens (cooked, or boiled & drained)  Eat a consistent number of servings per week of foods MEDIUM-HIGH in Vitamin K (1 serving = 1 cup)  Asparagus (cooked, or boiled & drained) Broccoli (cooked, boiled & drained, or raw & chopped) Brussel sprouts (cooked, or boiled & drained) *serving size only =  cup Lettuce, raw (green leaf, endive, romaine) Spinach, raw Turnip greens, raw & chopped   These websites have more information on Coumadin (warfarin):  http://www.king-russell.com/; https://www.hines.net/;

## 2016-11-22 NOTE — Progress Notes (Addendum)
      [address]       [place] [id number]             [phone]        5 Days Post-Op Procedure(s) (LRB): CORONARY ARTERY BYPASS GRAFTING (CABG)x4   LIMA-LAD DVG-OM SVG-PD SVG-DIAG (N/A) TRANSESOPHAGEAL ECHOCARDIOGRAM (TEE) (N/A)  Subjective: Patient states he does not urinate like most people. He really does not want foley put back in. He got some good rest last night. He did not have left sided chest pain last night (as did the previous night).  Objective: Vital signs in last 24 hours: Temp:  [97.5 F (36.4 C)-98.5 F (36.9 C)] 97.5 F (36.4 C) (02/14 0517) Pulse Rate:  [68-126] 89 (02/14 0517) Cardiac Rhythm: Sinus bradycardia (02/14 0321) Resp:  [18] 18 (02/14 0517) BP: (99-119)/(58-78) 108/67 (02/14 0517) SpO2:  [93 %-96 %] 94 % (02/14 0517) Weight:  [202 lb 1.6 oz (91.7 kg)] 202 lb 1.6 oz (91.7 kg) (02/14 0517)  Pre op weight 94.3 kg Current Weight  11/22/16 202 lb 1.6 oz (91.7 kg)      Intake/Output from previous day: 02/13 0701 - 02/14 0700 In: 390 [P.O.:240; I.V.:150] Out: 650 [Urine:650]   Physical Exam:  Cardiovascular: IRRR IRRR Pulmonary: Clear to auscultation bilaterally Abdomen: Soft, non tender, bowel sounds present. Extremities: Bilateral lower extremity edema. Wounds: Clean and dry.  No erythema or signs of infection.  Lab Results: CBC: Recent Labs  11/20/16 0515 11/21/16 0243  WBC 8.2 8.2  HGB 7.3* 7.7*  HCT 22.1* 23.6*  PLT 124* 168   BMET:  Recent Labs  11/20/16 0515 11/21/16 0243  NA 136 138  K 4.2 4.4  CL 103 102  CO2 26 27  GLUCOSE 104* 98  BUN 20 20  CREATININE 1.19 1.30*  CALCIUM 7.9* 8.1*    PT/INR:  Lab Results  Component Value Date   INR 1.05 11/22/2016   INR 1.31 11/17/2016   INR 0.98 11/16/2016   ABG:  INR: Will add last result for INR, ABG once components are confirmed Will add last 4 CBG results once components are confirmed  Assessment/Plan:  1. CV -  S/p STEMI. PAF A fib in  the low 100's this am. On Lopressor 25 mg bid, Amiodarone 400 mg bid, and Coumadin. INR  1.05 this am. Continue with Coumadin 5 mg daily for now. 2.  Pulmonary - On room air. Encourage incentive spirometer. 3. Volume Overload - On Lasix 40 mg daily 4.  Acute blood loss anemia - H and H yesterday stable at 7.7 and 23.6 5. Urinary retention-bladder scanned last evening (554 ml) and has not voided yet. Will in and out cath and continueFlomax. May need to have foley reinserted. 6. Creatinine yesterday 1.3. Will recheck in am 7. Remove EPW in am  ZIMMERMAN,DONIELLE MPA-C 11/22/2016,7:29 AM Patient seen and examined, agree with above Still with voiding issues. I/O cathed for 800 ml this AM Mostly SR today  Marionette Meskill C. Dorris FetchHendrickson, MD [facility] [phone]

## 2016-11-22 NOTE — Progress Notes (Signed)
CARDIAC REHAB PHASE I   PRE:  Rate/Rhythm: 65   BP:  Sitting: 133/76        SaO2: 100 RA  MODE:  Ambulation: 450 ft   POST:  Rate/Rhythm: 84   BP:  Sitting: 124/75         SaO2: 100 RA  Pt ambulated 450 ft on RA, rolling walker, steady gait, tolerated well with no complaints. Encouraged IS, additional ambulation x2 today. Pt to recliner after walk, feet elevated, call bell within reach. Will follow.   [phone] Joylene GrapesEmily C Yasmene Salomone, RN, BSN 11/22/2016 10:12 AM

## 2016-11-22 NOTE — Progress Notes (Signed)
Pt in and out cathed. 2 persons present. emptied from pts bladder. Pt due to void by 3pm. Urinal at bedside. Call and phone within reach. Will continue to monitor.

## 2016-11-22 NOTE — Progress Notes (Signed)
Patient lying in bed.  Feels much better tonight no needs at this time. Call light within reach.

## 2016-11-22 NOTE — Discharge Summary (Signed)
Physician Discharge Summary       [address] [place]       [place] [address]             [phone]    Patient ID: Dennis Paul MRN: [id number] DOB/AGE: [birth year] 66 y.o.  Admit date: 11/16/2016 Discharge date: 11/24/2016  Admission Diagnoses: 1.ST elevation myocardial infarction (STEMI) of inferior wall, initial episode of care (HCC) 2. Coronary artery disease Active Diagnoses:  1. HTN (hypertension) 2. GERD (gastroesophageal reflux disease) 3. Hiatal hernia 4. PAF 5. ABL anemia 6. Urinary retention  Procedure (s):  Left Heart Cath and Coronary Angiography by Dr. Swaziland on 11/16/2016:  Conclusion     Mid RCA lesion, 70 %stenosed.  RPDA lesion, 95 %stenosed.  Prox Cx to Mid Cx lesion, 50 %stenosed.  Prox LAD lesion, 90 %stenosed.  Mid LAD lesion, 50 %stenosed.  Ost 2nd Diag lesion, 90 %stenosed.  There is mild to moderate left ventricular systolic dysfunction.  LV end diastolic pressure is normal.   1. Severe 3 vessel obstructive CAD 2. Mild to moderate LV dysfunction 3. 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 The patient is now pain free and ST elevation is improved. He has complex 3 vessel disease with ulcerated plaque in the proximal LAD and bifurcation disease at the take off of a large diagonal branch. Given the complexity of disease I think he would be best managed with CABG. Will maximize his medical therapy and consult CT surgery.      Median sternotomy, extracorporeal circulation, coronary artery bypass grafting x4 (left internal mammary artery to LAD, right internal mammary artery to posterior descending, saphenous vein graft to obtuse marginal, saphenous vein graft to second diagonal), endoscopic vein harvest, both thighs by Dr. Dorris Fetch on 11/17/2016.  History of Presenting Illness: This is a 66 yo man with a past history significant for hypertension and GERD. He has no prior cardiac  history. He has been poorly for the past week with cough, nasal congestion, but no fevers. He has had 3 episodes of chest pain in the past week. The first 2 were relatively minor and resolved without incident. This morning he went out to his truck and had sudden onset of severe substernal chest pain radiating to his left arm. He had some nausea and became diaphoretic. He went to the [facility] ED and ECG showed ST elevation. He was given ASA, heparin, NTG, morphine and transferred directly to the cath lab. Pain resolved prior to arriving in cath lab.  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 He currently is pain free. Dr. Dorris Fetch discussed the need for coronary artery bypass grafting surgery. Potential risks, benefits, and complications were discussed with the patient and he agreed to proceed with surgery. Pre operative duplex US showed no significant internal carotid artery stenosis bilaterally. He underwent a CABG x 4 on 11/17/2016.  Brief Hospital Course:  The patient was extubated the evening of surgery without difficulty. He remained afebrile and hemodynamically stable. He was initially bradycardic and AAI paced. Theone Murdoch, a line, chest tubes, and foley were removed early in the post operative course. Lopressor was started and titrated accordingly. He went into a fib with RVR on 02/11. He was put on Amiodarone. He then had PAF and was started on Coumadin. Daily PT and INR were obtained. He is currently on Couamdin 5 mg daily. His last INR was 1.79.  He will need to have a PT/INR drawn  48 hours after discharge from the hospital. INR should be between 2-3 with a goal of 2.5 for a fib. His QT was above 500 so Amiodarone was adjusted accordingly. He was volume over loaded and diuresed. He had ABL anemia. He did not require a post op transfusion. Last H and H was 8.7 and 25.7 . He was weaned off the insulin drip.  The patient's HGA1C pre op was 5.6. The patient was felt  surgically stable for transfer from the ICU to PCTU for further convalescence on 11/21/2016. He developed urinary retention and was put on Flomax. He required in and out cath and then had to have the foley re inserted. He continues to progress with cardiac rehab. He was ambulating on room air. He has been tolerating a diet and has had a bowel movement. Epicardial pacing wires were removed on 11/24/2016. Chest tube sutures will be removed the day of discharge. The patient is felt surgically stable for discharge today.    Latest Vital Signs: Blood pressure 108/90, pulse 66, temperature 98.4 F (36.9 C), temperature source Oral, resp. rate 17, height 5\' 9"  (1.753 m), weight 203 lb 11.2 oz (92.4 kg), SpO2 95 %.  Physical Exam: Cardiovascular: IRRR IRRR Pulmonary: Clear to auscultation bilaterally Abdomen: Soft, non tender, bowel sounds present. Extremities: Bilateral lower extremity edema. Wounds: Clean and dry.  No erythema or signs of infection.  Discharge Condition:Stable and discharged to home.  Recent laboratory studies:  Lab Results  Component Value Date   WBC 7.1 11/23/2016   HGB 8.7 (L) 11/23/2016   HCT 25.7 (L) 11/23/2016   MCV 91.1 11/23/2016   PLT 247 11/23/2016   Lab Results  Component Value Date   NA 139 11/24/2016   K 4.2 11/24/2016   CL 100 (L) 11/24/2016   CO2 27 11/24/2016   CREATININE 1.39 (H) 11/24/2016   GLUCOSE 95 11/24/2016    Diagnostic Studies:   Dg Chest 2 View  Result Date: 11/21/2016 CLINICAL DATA:  Status post CABG four days ago. EXAM: CHEST  2 VIEW COMPARISON:  Portable chest x-ray of November 19, 2016 FINDINGS: The lungs are well-expanded. There is persistent bibasilar atelectasis which is improving. Small bilateral pleural effusions remain. The heart is top-normal in size. The pulmonary vascularity is nearly normal. The mediastinum is normal in width. The sternal wires are intact. The bony thorax exhibits no acute abnormality. IMPRESSION: Near total  resolution of mild pulmonary edema. Improving bibasilar atelectasis and small bilateral pleural effusions. Electronically Signed   By: David  Swaziland M.D.   On: 11/21/2016 07:04   Discharge Medications: Allergies as of 11/24/2016   No Known Allergies     Medication List    STOP taking these medications   amLODipine 10 MG tablet Commonly known as:  NORVASC   cephALEXin 500 MG capsule Commonly known as:  KEFLEX   meloxicam 15 MG tablet Commonly known as:  MOBIC     TAKE these medications   amiodarone 200 MG tablet Commonly known as:  PACERONE Take 1 tablet (200 mg total) by mouth 2 (two) times daily.   aspirin 81 MG EC tablet Take 1 tablet (81 mg total) by mouth daily. Start taking on:  11/25/2016   atorvastatin 80 MG tablet Commonly known as:  LIPITOR Take 1 tablet (80 mg total) by mouth daily at 6 PM.   furosemide 40 MG tablet Commonly known as:  LASIX Take 1 tablet (40 mg total) by mouth 2 (two) times daily. For 3 days, then  once daily   metoprolol tartrate 25 MG tablet Commonly known as:  LOPRESSOR Take 1 tablet (25 mg total) by mouth 2 (two) times daily.   omeprazole 20 MG capsule Commonly known as:  PRILOSEC Take 20 mg by mouth daily.   oxyCODONE 5 MG immediate release tablet Commonly known as:  Oxy IR/ROXICODONE Take 1-2 tablets (5-10 mg total) by mouth every 6 (six) hours as needed for severe pain.   potassium chloride SA 20 MEQ tablet Commonly known as:  K-DUR,KLOR-CON Take 1 tablet (20 mEq total) by mouth daily. Start taking on:  11/25/2016   ranitidine 150 MG tablet Commonly known as:  ZANTAC Take 150 mg by mouth 2 (two) times daily.   tamsulosin 0.4 MG Caps capsule Commonly known as:  FLOMAX Take 1 capsule (0.4 mg total) by mouth daily. Start taking on:  11/25/2016   warfarin 5 MG tablet Commonly known as:  COUMADIN Take 1 tablet (5 mg total) by mouth daily at 6 PM. As directed by coumadin clinic      The patient has been discharged  on:   1.Beta Blocker:  Yes [x   ]                              No   [   ]                              If No, reason:  2.Ace Inhibitor/ARB: Yes [   ]                                     No  [  x  ]                                     If No, reason:  3.Statin:   Yes [  x ]                  No  [   ]                  If No, reason:  4.Ecasa:  Yes  [ x  ]                  No   [   ]                  If No, reason:  Follow Up Appointments: Follow-up Information    Loreli Slot, MD Follow up.   Specialty:  Cardiothoracic Surgery Why:  PA/LAT CXR to be taken (at [facility] which is in the same building as Dr. Sunday Corn office) on at;Appointment time is at Contact information: [address] [place] [address] [phone]        Peter Swaziland, MD Follow up.   Specialty:  Cardiology Why:  office will contact you with a 2 week follow-up appt with cardiology Contact information: [address] [place] [address] [phone]        Jerilee Field, MD Follow up.   Specialty:  Urology Why:  5-7 days  Contact information: [address] [place] [address] [phone]        [facility] Follow up.   Specialty:  Cardiology Why:  office will contact you with appt next week Contact information: [address], [address] [place] [id number] [phone]          Signed: Rogers BlockerGOLD,WAYNE EPA-C 11/24/2016, 11:22 AM

## 2016-11-23 LAB — CBC
HCT: 25.7 % — ABNORMAL LOW (ref 39.0–52.0)
HEMOGLOBIN: 8.7 g/dL — AB (ref 13.0–17.0)
MCH: 30.9 pg (ref 26.0–34.0)
MCHC: 33.9 g/dL (ref 30.0–36.0)
MCV: 91.1 fL (ref 78.0–100.0)
Platelets: 247 10*3/uL (ref 150–400)
RBC: 2.82 MIL/uL — AB (ref 4.22–5.81)
RDW: 13.5 % (ref 11.5–15.5)
WBC: 7.1 10*3/uL (ref 4.0–10.5)

## 2016-11-23 LAB — BASIC METABOLIC PANEL
ANION GAP: 11 (ref 5–15)
BUN: 21 mg/dL — ABNORMAL HIGH (ref 6–20)
CALCIUM: 8.9 mg/dL (ref 8.9–10.3)
CO2: 29 mmol/L (ref 22–32)
Chloride: 100 mmol/L — ABNORMAL LOW (ref 101–111)
Creatinine, Ser: 1.36 mg/dL — ABNORMAL HIGH (ref 0.61–1.24)
GFR, EST NON AFRICAN AMERICAN: 53 mL/min — AB (ref >60)
Glucose, Bld: 110 mg/dL — ABNORMAL HIGH (ref 65–99)
Potassium: 4.2 mmol/L (ref 3.5–5.1)
Sodium: 140 mmol/L (ref 135–145)

## 2016-11-23 LAB — PROTIME-INR
INR: 1.19
PROTHROMBIN TIME: 15.2 s (ref 11.4–15.2)

## 2016-11-23 NOTE — Progress Notes (Signed)
CARDIAC REHAB PHASE I   PRE:  Rate/Rhythm: 65 SR  BP:  Supine:   Sitting: 112/73  Standing:    SaO2: 97%RA  MODE:  Ambulation: 690 ft   POST:  Rate/Rhythm: 76 SR  BP:  Supine:   Sitting: 125/72  Standing:    SaO2: 97%RA 1026-1052 Pt walked 690 ft on RA with rolling walker and minimal asst. Gait steady and he tolerated well. To recliner with call bell. Foley intact.   Luetta Nuttingharlene Adriana Quinby, RN BSN  11/23/2016 10:46 AM

## 2016-11-23 NOTE — Care Management Important Message (Signed)
Important Message  Patient Details  Name: Dennis Paul MRN: [id number] Date of Birth: 1951-08-23   Medicare Important Message Given:  Yes    Kyla BalzarineShealy, Marty Sadlowski Abena 11/23/2016, 10:41 AM

## 2016-11-23 NOTE — Consult Note (Signed)
Urology Consult   Physician requesting consult: Charlett LangoSteven Hendrickson  Reason for consult: Post op urinary retention  History of Present Illness: Dennis Paul is a 66 y.o. male with PMH significant for asthma, GERD, and HTN.  He is POD 6 s/p CABG x 4.  He developed afib post operatively and was started on Coumadin. He also developed post op urinary retention with volumes up to 800cc.  This has required multiple catheter placements over the past few days (first cath was placed during the night and removed the following morning, second was I/O cath, and the third was placed yesterday and is currently draining well). He was started on Flomax 11/20/16.  Cr has been slightly elevated post op with current value of 1.36.  UA negative.  Pt states that he has had some difficulty urinating for at least 5 years.  He describes difficulty getting his stream started and a weak stream.  [facility] 4-5 years ago and was given a medicine that relieved his sx.  As soon as his sx improved he stopped the medication, his sx returned, and he did not take the med again.  He denies nocturia, frequency, urgency, incontinence, dysuria, hematuria, feelings of incomplete emptying, and constipation.  It has been many years since he had a rectal exam or a PSA check.   He is currently resting comfortably and is without complaint.  He denies F/C, HA, CP, SOB, N/V, and constipation.    Past Medical History:  Diagnosis Date  . Asthma    had as a child, wheezes now w/ URI  . GERD (gastroesophageal reflux disease)   . Hiatal hernia   . HTN (hypertension)     Past Surgical History:  Procedure Laterality Date  . CORONARY ARTERY BYPASS GRAFT N/A 11/17/2016   Procedure: CORONARY ARTERY BYPASS GRAFTING (CABG)x4   LIMA-LAD DVG-OM SVG-PD SVG-DIAG;  Surgeon: Loreli SlotSteven C Hendrickson, MD;  Location: [facility] OR;  Service: Open Heart Surgery;  Laterality: N/A;  . LEFT HEART CATH AND CORONARY ANGIOGRAPHY N/A 11/16/2016    Procedure: Left Heart Cath and Coronary Angiography;  Surgeon: Peter M SwazilandJordan, MD;  [facility];  Service: Cardiovascular;  Laterality: N/A;  . TEE WITHOUT CARDIOVERSION N/A 11/17/2016   Procedure: TRANSESOPHAGEAL ECHOCARDIOGRAM (TEE);  Surgeon: Loreli SlotSteven C Hendrickson, MD;  [facility];  Service: Open Heart Surgery;  Laterality: N/A;    Current Hospital Medications:  Home Meds:  Current Meds  Medication Sig  . amLODipine (NORVASC) 10 MG tablet Take 5 mg by mouth daily.  . cephALEXin (KEFLEX) 500 MG capsule Take 500 mg by mouth 4 (four) times daily. Unknown start date. Only two pills left as of 11-16-16  . meloxicam (MOBIC) 15 MG tablet Take 7.5 mg by mouth daily.  Marland Kitchen. omeprazole (PRILOSEC) 20 MG capsule Take 20 mg by mouth daily.  . ranitidine (ZANTAC) 150 MG tablet Take 150 mg by mouth 2 (two) times daily.    Scheduled Meds: . amiodarone  150 mg Intravenous Once  . amiodarone  400 mg Oral BID  . aspirin EC  81 mg Oral Daily  . atorvastatin  80 mg Oral q1800  . bisacodyl  10 mg Oral Daily   Or  . bisacodyl  10 mg Rectal Daily  . docusate sodium  200 mg Oral Daily  . enoxaparin (LOVENOX) injection  40 mg Subcutaneous QHS  . furosemide  40 mg Oral Daily  . metoprolol tartrate  25 mg Oral  BID  . moving right along book   Does not apply Once  . pantoprazole  40 mg Oral Daily  . potassium chloride  20 mEq Oral Daily  . sodium chloride flush  3 mL Intravenous Q12H  . tamsulosin  0.4 mg Oral Daily  . warfarin  5 mg Oral q1800  . Warfarin - Physician Dosing Inpatient   Does not apply q1800   Continuous Infusions: PRN Meds:.sodium chloride, alum & mag hydroxide-simeth, magnesium hydroxide, metoprolol, ondansetron (ZOFRAN) IV, oxyCODONE, sodium chloride flush, traMADol, zolpidem  Allergies: No Known Allergies  Family History  Problem Relation Age of Onset  . Hypertension Mother   . Hypertension Father     Social History:  reports that he has never smoked. He has  never used smokeless tobacco. He reports that he does not drink alcohol or use drugs.  ROS: A complete review of systems was performed.  All systems are negative except for pertinent findings as noted.  Physical Exam:  Vital signs in last 24 hours: Temp:  [98 F (36.7 C)-98.4 F (36.9 C)] 98.4 F (36.9 C) (02/15 1338) Pulse Rate:  [59-64] 60 (02/15 1338) Resp:  [17-18] 17 (02/15 1338) BP: (106-112)/(57-67) 112/63 (02/15 1338) SpO2:  [94 %-96 %] 96 % (02/15 1338) Weight:  [98.7 kg (217 lb 11.2 oz)] 98.7 kg (217 lb 11.2 oz) (02/15 0553) Constitutional:  Alert and oriented, No acute distress Cardiovascular: Regular rate and rhythm Respiratory: Normal respiratory effort GI: Abdomen is soft, nontender, nondistended, no abdominal masses GU: uncirc penis with 63f foley in place draining clear/yellow urine; no skin breakdown; no discharge Lymphatic: No lymphadenopathy Neurologic: Grossly intact, no focal deficits Psychiatric: Normal mood and affect  Laboratory Data:   Recent Labs  11/21/16 0243 11/23/16 0758  WBC 8.2 7.1  HGB 7.7* 8.7*  HCT 23.6* 25.7*  PLT 168 247     Recent Labs  11/21/16 0243 11/23/16 0758  NA 138 140  K 4.4 4.2  CL 102 100*  GLUCOSE 98 110*  BUN 20 21*  CALCIUM 8.1* 8.9  CREATININE 1.30* 1.36*     Results for orders placed or performed during the hospital encounter of 11/16/16 (from the past 24 hour(s))  Protime-INR     Status: None   Collection Time: 11/23/16  3:01 AM  Result Value Ref Range   Prothrombin Time 15.2 11.4 - 15.2 seconds   INR 1.19   Basic metabolic panel     Status: Abnormal   Collection Time: 11/23/16  7:58 AM  Result Value Ref Range   Sodium 140 135 - 145 mmol/L   Potassium 4.2 3.5 - 5.1 mmol/L   Chloride 100 (L) 101 - 111 mmol/L   CO2 29 22 - 32 mmol/L   Glucose, Bld 110 (H) 65 - 99 mg/dL   BUN 21 (H) 6 - 20 mg/dL   Creatinine, Ser 4.69 (H) 0.61 - 1.24 mg/dL   Calcium 8.9 8.9 - 62.9 mg/dL   GFR calc non Af Amer 53  (L) >60 mL/min   GFR calc Af Amer >60 >60 mL/min   Anion gap 11 5 - 15  CBC     Status: Abnormal   Collection Time: 11/23/16  7:58 AM  Result Value Ref Range   WBC 7.1 4.0 - 10.5 K/uL   RBC 2.82 (L) 4.22 - 5.81 MIL/uL   Hemoglobin 8.7 (L) 13.0 - 17.0 g/dL   HCT 52.8 (L) 41.3 - 24.4 %   MCV 91.1 78.0 - 100.0 fL  MCH 30.9 26.0 - 34.0 pg   MCHC 33.9 30.0 - 36.0 g/dL   RDW 16.1 09.6 - 04.5 %   Platelets 247 150 - 400 K/uL   Recent Results (from the past 240 hour(s))  MRSA PCR Screening     Status: None   Collection Time: 11/16/16  2:41 PM  Result Value Ref Range Status   MRSA by PCR NEGATIVE NEGATIVE Final    Comment:        The GeneXpert MRSA Assay (FDA approved for NASAL specimens only), is one component of a comprehensive MRSA colonization surveillance program. It is not intended to diagnose MRSA infection nor to guide or monitor treatment for MRSA infections.     Renal Function:  Recent Labs  11/18/16 0321 11/18/16 1639 11/18/16 1645 11/19/16 0400 11/20/16 0515 11/21/16 0243 11/23/16 0758  CREATININE 1.28* 1.20 1.27* 1.38* 1.19 1.30* 1.36*   Estimated Creatinine Clearance: 61.9 mL/min (by C-G formula based on SCr of 1.36 mg/dL (H)).  Radiologic Imaging: No results found.   Impression/Recommendation  Post op urinary retention--multiple contributing factors including BPH, pain meds, decreased mobility, and possibly trauma from multiple foley placements.  Leave foley in place for 5-7 days and continue Flomax.  Continue bowel regimen to prevent constipation.  He will need an outpt void trial in our office as well as a rectal exam and PSA check in the future (after recovery from retention as this will cause falsely elevated value).  Pt and wife will need foley care education prior to d/c home.  He should go home with large bag.  Instructed pt on importance of retracting/replacing foreskin to prevent phimosis/paraphimosis.  Continue to monitor Cr.  He is diuresing  and retention no longer an issue.   Novelle Addair 11/23/2016, 3:02 PM

## 2016-11-23 NOTE — Progress Notes (Signed)
Pt ambulated 300 ft with min assist using rolling walker.  No complaints, no tele alarms.  Will con't plan of care.

## 2016-11-23 NOTE — Progress Notes (Addendum)
[address]       [place] [id number]             [phone]      6 Days Post-Op Procedure(s) (LRB): CORONARY ARTERY BYPASS GRAFTING (CABG)x4   LIMA-LAD DVG-OM SVG-PD SVG-DIAG (N/A) TRANSESOPHAGEAL ECHOCARDIOGRAM (TEE) (N/A) Subjective: Feels fairly well, foley back in. Conts with some intermit. Afib, sinus brady.   Objective: Vital signs in last 24 hours: Temp:  [97.5 F (36.4 C)-98.1 F (36.7 C)] 98.1 F (36.7 C) (02/15 0553) Pulse Rate:  [59-68] 59 (02/15 0553) Cardiac Rhythm: Normal sinus rhythm (02/14 2002) Resp:  [18] 18 (02/15 0553) BP: (106-124)/(57-75) 110/67 (02/15 0553) SpO2:  [94 %-95 %] 94 % (02/15 0553) Weight:  [217 lb 11.2 oz (98.7 kg)] 217 lb 11.2 oz (98.7 kg) (02/15 0553)  Hemodynamic parameters for last 24 hours:    Intake/Output from previous day: 02/14 0701 - 02/15 0700 In: 480 [P.O.:480] Out: 3500 [Urine:3500] Intake/Output this shift: No intake/output data recorded.  General appearance: alert, cooperative and no distress Heart: regular rate and rhythm and + rub Lungs: dim in bases Abdomen: benign Extremities: + pitting edema Wound: incis healing well  Lab Results:  Recent Labs  11/21/16 0243  WBC 8.2  HGB 7.7*  HCT 23.6*  PLT 168   BMET:  Recent Labs  11/21/16 0243  NA 138  K 4.4  CL 102  CO2 27  GLUCOSE 98  BUN 20  CREATININE 1.30*  CALCIUM 8.1*    PT/INR:  Recent Labs  11/23/16 0301  LABPROT 15.2  INR 1.19   ABG    Component Value Date/Time   PHART 7.301 (L) 11/17/2016 2117   HCO3 24.2 11/17/2016 2117   TCO2 25 11/18/2016 1639   ACIDBASEDEF 2.0 11/17/2016 2117   O2SAT 97.0 11/17/2016 2117   CBG (last 3)   Recent Labs  11/20/16 2121 11/21/16 0847 11/21/16 1246  GLUCAP 97 77 131*    Meds Scheduled Meds: . amiodarone  150 mg Intravenous Once  . amiodarone  400 mg Oral BID  . aspirin EC  81 mg Oral Daily  . atorvastatin  80 mg Oral q1800  . bisacodyl  10 mg Oral Daily   Or    . bisacodyl  10 mg Rectal Daily  . docusate sodium  200 mg Oral Daily  . enoxaparin (LOVENOX) injection  40 mg Subcutaneous QHS  . furosemide  40 mg Oral Daily  . metoprolol tartrate  25 mg Oral BID  . moving right along book   Does not apply Once  . pantoprazole  40 mg Oral Daily  . potassium chloride  20 mEq Oral Daily  . sodium chloride flush  3 mL Intravenous Q12H  . tamsulosin  0.4 mg Oral Daily  . warfarin  5 mg Oral q1800  . Warfarin - Physician Dosing Inpatient   Does not apply q1800   Continuous Infusions: PRN Meds:.sodium chloride, alum & mag hydroxide-simeth, magnesium hydroxide, metoprolol, ondansetron (ZOFRAN) IV, oxyCODONE, sodium chloride flush, traMADol, zolpidem  Xrays No results found.  Assessment/Plan: S/P Procedure(s) (LRB): CORONARY ARTERY BYPASS GRAFTING (CABG)x4   LIMA-LAD DVG-OM SVG-PD SVG-DIAG (N/A) TRANSESOPHAGEAL ECHOCARDIOGRAM (TEE) (N/A)  1 feels fairly well overall 2 afib, conts current rx- QTc 480, on coumadin now 3 urinary retention- foley in place on flomax- will need voiding trial at some point, poss urology consult.  4 cont diuresis, has significant periph edema, >3 liters out yesterday 5 cbg's adeq control, A1C  5.6 6 labs pending  LOS: 7 days    GOLD,WAYNE E 11/23/2016 Foley replaced. Will ask Urology to see I suspect elevated creatinine is due to retention Home in next day or two depending on GU issues and INR  Viviann Spare C. Dorris Fetch, MD [facility] [phone]

## 2016-11-24 ENCOUNTER — Telehealth: Payer: Self-pay | Admitting: Pharmacist

## 2016-11-24 LAB — URINALYSIS, ROUTINE W REFLEX MICROSCOPIC
Bilirubin Urine: NEGATIVE
Glucose, UA: NEGATIVE mg/dL
Ketones, ur: NEGATIVE mg/dL
Leukocytes, UA: NEGATIVE
NITRITE: NEGATIVE
Protein, ur: 30 mg/dL — AB
Specific Gravity, Urine: 1.021 (ref 1.005–1.030)
pH: 5 (ref 5.0–8.0)

## 2016-11-24 LAB — PROTIME-INR
INR: 1.79
PROTHROMBIN TIME: 21 s — AB (ref 11.4–15.2)

## 2016-11-24 LAB — BASIC METABOLIC PANEL
ANION GAP: 12 (ref 5–15)
BUN: 20 mg/dL (ref 6–20)
CALCIUM: 8.9 mg/dL (ref 8.9–10.3)
CO2: 27 mmol/L (ref 22–32)
Chloride: 100 mmol/L — ABNORMAL LOW (ref 101–111)
Creatinine, Ser: 1.39 mg/dL — ABNORMAL HIGH (ref 0.61–1.24)
GFR, EST AFRICAN AMERICAN: 59 mL/min — AB (ref >60)
GFR, EST NON AFRICAN AMERICAN: 51 mL/min — AB (ref >60)
Glucose, Bld: 95 mg/dL (ref 65–99)
Potassium: 4.2 mmol/L (ref 3.5–5.1)
SODIUM: 139 mmol/L (ref 135–145)

## 2016-11-24 MED ORDER — ATORVASTATIN CALCIUM 80 MG PO TABS: 80.0000 mg | ORAL_TABLET | Freq: Every day | ORAL | 1 refills | Status: DC

## 2016-11-24 MED ORDER — FUROSEMIDE 40 MG PO TABS: 40.0000 mg | ORAL_TABLET | Freq: Two times a day (BID) | ORAL | 0 refills | Status: DC

## 2016-11-24 MED ORDER — OXYCODONE HCL 5 MG PO TABS: 5.0000 mg | ORAL_TABLET | Freq: Four times a day (QID) | ORAL | 0 refills | Status: DC | PRN

## 2016-11-24 MED ORDER — WARFARIN SODIUM 5 MG PO TABS: 5.0000 mg | ORAL_TABLET | Freq: Every day | ORAL | 1 refills | Status: DC

## 2016-11-24 MED ORDER — TAMSULOSIN HCL 0.4 MG PO CAPS: 0.4000 mg | ORAL_CAPSULE | Freq: Every day | ORAL | 1 refills | Status: AC

## 2016-11-24 MED ORDER — POTASSIUM CHLORIDE CRYS ER 20 MEQ PO TBCR: 20.0000 meq | EXTENDED_RELEASE_TABLET | Freq: Every day | ORAL | 0 refills | Status: DC

## 2016-11-24 MED ORDER — METOPROLOL TARTRATE 25 MG PO TABS: 25.0000 mg | ORAL_TABLET | Freq: Two times a day (BID) | ORAL | 1 refills | Status: DC

## 2016-11-24 MED ORDER — ASPIRIN 81 MG PO TBEC: 81.0000 mg | DELAYED_RELEASE_TABLET | Freq: Every day | ORAL | Status: AC

## 2016-11-24 MED ORDER — FUROSEMIDE 40 MG PO TABS: 40.0000 mg | ORAL_TABLET | Freq: Two times a day (BID) | ORAL | Status: DC

## 2016-11-24 MED ORDER — AMIODARONE HCL 200 MG PO TABS
200.0000 mg | ORAL_TABLET | Freq: Two times a day (BID) | ORAL | Status: DC
  Administered 2016-11-24: 200 mg via ORAL
  Filled 2016-11-24: qty 1

## 2016-11-24 MED ORDER — AMIODARONE HCL 200 MG PO TABS: 200.0000 mg | ORAL_TABLET | Freq: Two times a day (BID) | ORAL | 1 refills | Status: DC

## 2016-11-24 NOTE — Progress Notes (Signed)
Pt discharging home with wife.  Chest tube sutures removed and steri's applied.  Sites unremarkable.  Catheter drainage bag changed over to leg bag, Pt and wife educated on application and emptying, return demonstration successful.  All instructions and prescriptions given and reviewed, all questions answered.

## 2016-11-24 NOTE — Care Management Note (Addendum)
Case Management Note Donn PieriniKristi Finnigan Warriner RN, BSN Unit 2W-Case Manager [phone]  Patient Details  Name: Dennis KaysJeremiah Paul MRN: [id number] Date of Birth: 1951/01/15  Subjective/Objective:  Pt admitted with NSTEMI, s/p CABGx3- tx from 2S to 2W on 11/21/16              Action/Plan: PTA Pt lived at home with wife- plan to return home with wife- has a walker at home if needed- no CM needs noted for discharge.   Expected Discharge Date:  11/24/16               Expected Discharge Plan:  Home/Self Care  In-House Referral:     Discharge planning Services  CM Consult  Post Acute Care Choice:  NA Choice offered to:  NA  DME Arranged:    DME Agency:     HH Arranged:    HH Agency:     Status of Service:  Completed, signed off  If discussed at Long Length of Stay Meetings, dates discussed:    Discharge Disposition: home/self care   Additional Comments:  Darrold SpanWebster, Lonya Johannesen Hall, RN 11/24/2016, 12:05 PM

## 2016-11-24 NOTE — Progress Notes (Addendum)
[address]       [place] [id number]             [phone]      7 Days Post-Op Procedure(s) (LRB): CORONARY ARTERY BYPASS GRAFTING (CABG)x4   LIMA-LAD DVG-OM SVG-PD SVG-DIAG (N/A) TRANSESOPHAGEAL ECHOCARDIOGRAM (TEE) (N/A) Subjective: Feels pretty well  Objective: Vital signs in last 24 hours: Temp:  [97.7 F (36.5 C)-98.4 F (36.9 C)] 98 F (36.7 C) (02/16 0457) Pulse Rate:  [60-71] 62 (02/16 0457) Cardiac Rhythm: Normal sinus rhythm (02/15 2130) Resp:  [17-20] 17 (02/16 0457) BP: (99-118)/(59-76) 114/76 (02/16 0457) SpO2:  [95 %-96 %] 95 % (02/16 0457) Weight:  [203 lb 11.2 oz (92.4 kg)] 203 lb 11.2 oz (92.4 kg) (02/16 0457)  Hemodynamic parameters for last 24 hours:    Intake/Output from previous day: 02/15 0701 - 02/16 0700 In: 1180 [P.O.:480] Out: 1300 [Urine:1300] Intake/Output this shift: No intake/output data recorded.  General appearance: alert, cooperative and no distress Heart: regular rate and rhythm Lungs: dim Left>right base Abdomen: benign Extremities: + pitting edema Wound: incis healing well  Lab Results:  Recent Labs  11/23/16 0758  WBC 7.1  HGB 8.7*  HCT 25.7*  PLT 247   BMET:  Recent Labs  11/23/16 0758  NA 140  K 4.2  CL 100*  CO2 29  GLUCOSE 110*  BUN 21*  CREATININE 1.36*  CALCIUM 8.9    PT/INR:  Recent Labs  11/24/16 0240  LABPROT 21.0*  INR 1.79   ABG    Component Value Date/Time   PHART 7.301 (L) 11/17/2016 2117   HCO3 24.2 11/17/2016 2117   TCO2 25 11/18/2016 1639   ACIDBASEDEF 2.0 11/17/2016 2117   O2SAT 97.0 11/17/2016 2117   CBG (last 3)   Recent Labs  11/21/16 0847 11/21/16 1246  GLUCAP 77 131*    Meds Scheduled Meds: . amiodarone  150 mg Intravenous Once  . amiodarone  400 mg Oral BID  . aspirin EC  81 mg Oral Daily  . atorvastatin  80 mg Oral q1800  . bisacodyl  10 mg Oral Daily   Or  . bisacodyl  10 mg Rectal Daily  . docusate sodium  200 mg Oral Daily  .  enoxaparin (LOVENOX) injection  40 mg Subcutaneous QHS  . furosemide  40 mg Oral Daily  . metoprolol tartrate  25 mg Oral BID  . moving right along book   Does not apply Once  . pantoprazole  40 mg Oral Daily  . potassium chloride SA  20 mEq Oral Daily  . sodium chloride flush  3 mL Intravenous Q12H  . tamsulosin  0.4 mg Oral Daily  . warfarin  5 mg Oral q1800  . Warfarin - Physician Dosing Inpatient   Does not apply q1800   Continuous Infusions: PRN Meds:.sodium chloride, alum & mag hydroxide-simeth, magnesium hydroxide, metoprolol, ondansetron (ZOFRAN) IV, oxyCODONE, sodium chloride flush, traMADol, zolpidem  Xrays No results found.  Assessment/Plan: S/P Procedure(s) (LRB): CORONARY ARTERY BYPASS GRAFTING (CABG)x4   LIMA-LAD DVG-OM SVG-PD SVG-DIAG (N/A) TRANSESOPHAGEAL ECHOCARDIOGRAM (TEE) (N/A)  1 steady progress 2 urology has seen and will f/u as outpatient with foley in place at discharge, urine looks cloudy- will check UA 3 fair UO if accurate, may need to increase diuretic with significant periph edema. Recheck renal fxn 4 push pulm toilet and rehab as able 5 cont coumadin 6 QTc is now consist >500, in sinus, reduce amio  LOS: 8 days  GOLD,WAYNE E 11/24/2016 Patient seen and examined, agree with above I think he is Ok to go home today with Foley in place Will increase lasix to BID for 3 days then back to once daily  Viviann Spare C. Dorris Fetch, MD [facility] [phone]

## 2016-11-24 NOTE — Progress Notes (Signed)
EPWs pulled per verbal and protocol.  VSS, Pt tolerated well.  However 1 metal tip missing from ventricular wire set. PA Gold made aware.  No intervention, plan to monitor for 4 hours post procedure.  Pt understands bedrest for one hr with frequent VS checks.  CCMD notified, will con't plan of care.

## 2016-11-24 NOTE — Telephone Encounter (Signed)
-----   Message from Rowe ClackWayne E Gold, [place]-C sent at 11/24/2016 11:17 AM EST ----- Please contact patient with appointment time and arrange coumadin clinic follow up next mon or tues. Had post op afib following CABG. Going home on 5 hg with INR 1.7 today  thanks

## 2016-11-24 NOTE — Telephone Encounter (Signed)
[facility] Pt states understanding.

## 2016-11-24 NOTE — Telephone Encounter (Signed)
Patient still admitted will call this afternoon to schedule.

## 2016-11-24 NOTE — Progress Notes (Signed)
[id number] Pt on bedrest from pacing wire removal. Education completed with pt and wife who voiced understanding. He stated he has walker at home if needed. Stressed IS, sternal precautions, heart healthy diet and ex ed. Discussed CRP 2 and [facility]. Luetta Nuttingharlene Lakesia Dahle RN BSN 11/24/2016 11:57 AM

## 2016-11-25 LAB — URINE CULTURE: Culture: NO GROWTH

## 2016-11-27 ENCOUNTER — Ambulatory Visit (INDEPENDENT_AMBULATORY_CARE_PROVIDER_SITE_OTHER): Payer: Medicare Other

## 2016-11-27 DIAGNOSIS — Z5181 Encounter for therapeutic drug level monitoring: Secondary | ICD-10-CM | POA: Diagnosis not present

## 2016-11-27 DIAGNOSIS — I4891 Unspecified atrial fibrillation: Secondary | ICD-10-CM | POA: Diagnosis not present

## 2016-11-27 DIAGNOSIS — I2119 ST elevation (STEMI) myocardial infarction involving other coronary artery of inferior wall: Secondary | ICD-10-CM

## 2016-11-27 LAB — POCT INR: INR: 5

## 2016-11-27 NOTE — Patient Instructions (Signed)

## 2016-11-28 ENCOUNTER — Telehealth: Payer: Self-pay | Admitting: Surgical

## 2016-11-28 DIAGNOSIS — R339 Retention of urine, unspecified: Secondary | ICD-10-CM | POA: Diagnosis not present

## 2016-11-28 DIAGNOSIS — Z7982 Long term (current) use of aspirin: Secondary | ICD-10-CM | POA: Diagnosis not present

## 2016-11-28 DIAGNOSIS — Z87891 Personal history of nicotine dependence: Secondary | ICD-10-CM | POA: Diagnosis not present

## 2016-11-28 DIAGNOSIS — Z951 Presence of aortocoronary bypass graft: Secondary | ICD-10-CM | POA: Diagnosis not present

## 2016-11-28 DIAGNOSIS — Z7901 Long term (current) use of anticoagulants: Secondary | ICD-10-CM | POA: Diagnosis not present

## 2016-11-28 DIAGNOSIS — R338 Other retention of urine: Secondary | ICD-10-CM | POA: Diagnosis not present

## 2016-11-28 NOTE — Telephone Encounter (Signed)
      [address]       [place] [id number]             [phone]    Dennis KaysJeremiah Paul [id number]  S/P   11/17/2016                     OPERATIVE REPORT   PREOPERATIVE DIAGNOSIS:  Three-vessel coronary artery disease, status post ST-elevation myocardial infarction.  POSTOPERATIVE DIAGNOSIS:  Three-vessel coronary artery disease, status post ST-elevation myocardial infarction.  PROCEDURE:   Median sternotomy, extracorporeal circulation, Coronary artery bypass grafting x 4      Left internal mammary artery to LAD,      Right internal mammary artery to posterior descending,      Saphenous vein graft to obtuse marginal,      Saphenous vein graft to second diagonal Endoscopic vein harvest, both thighs.  SURGEON:  Salvatore DecentSteven C. Dorris FetchHendrickson, MD.  ASSISTANT:  Rowe ClackWayne E. Saraya Tirey, PA-C.  ANESTHESIA:  General.  FINDINGS:  Transesophageal echocardiography showed some inferior apical hypokinesis, but overall preserved left ventricular function.  No significant valvular pathology.  Good quality target vessels.  Good quality mammary arteries.  Saphenous vein to OM- good quality. Saphenous vein to diagonal- fair quality.  Overall- vein poor quality.  Allergies as of 11/28/2016   No Known Allergies     Medication List       Accurate as of 11/28/16  9:15 AM. Always use your most recent med list.          amiodarone 200 MG tablet Commonly known as:  PACERONE Take 1 tablet (200 mg total) by mouth 2 (two) times daily.   aspirin 81 MG EC tablet Take 1 tablet (81 mg total) by mouth daily.   atorvastatin 80 MG tablet Commonly known as:  LIPITOR Take 1 tablet (80 mg total) by mouth daily at 6 PM.   furosemide 40 MG tablet Commonly known as:  LASIX Take 1 tablet (40 mg total) by mouth 2 (two) times daily. For 3 days, then once daily   metoprolol tartrate 25 MG tablet Commonly known as:  LOPRESSOR Take 1 tablet (25 mg total) by mouth 2 (two) times daily.     omeprazole 20 MG capsule Commonly known as:  PRILOSEC Take 20 mg by mouth daily.   oxyCODONE 5 MG immediate release tablet Commonly known as:  Oxy IR/ROXICODONE Take 1-2 tablets (5-10 mg total) by mouth every 6 (six) hours as needed for severe pain.   potassium chloride SA 20 MEQ tablet Commonly known as:  K-DUR,KLOR-CON Take 1 tablet (20 mEq total) by mouth daily.   ranitidine 150 MG tablet Commonly known as:  ZANTAC Take 150 mg by mouth 2 (two) times daily.   tamsulosin 0.4 MG Caps capsule Commonly known as:  FLOMAX Take 1 capsule (0.4 mg total) by mouth daily.   warfarin 5 MG tablet Commonly known as:  COUMADIN Take 1 tablet (5 mg total) by mouth daily at 6 PM. As directed by coumadin clinic      Date of discharge: 11/24/2016  Coumadin:  INR check scheduled 2/27  Problems/Concerns:foley removed in urology office today and plan in place if unable to void  Assessment:  Patient is doing well.   Further intructions provided.  Contact office if concerns or problems develop  Follow up Appointment: scheduled

## 2016-12-01 DIAGNOSIS — R338 Other retention of urine: Secondary | ICD-10-CM | POA: Diagnosis not present

## 2016-12-05 ENCOUNTER — Ambulatory Visit (INDEPENDENT_AMBULATORY_CARE_PROVIDER_SITE_OTHER): Payer: Medicare Other | Admitting: *Deleted

## 2016-12-05 DIAGNOSIS — Z5181 Encounter for therapeutic drug level monitoring: Secondary | ICD-10-CM

## 2016-12-05 DIAGNOSIS — I2119 ST elevation (STEMI) myocardial infarction involving other coronary artery of inferior wall: Secondary | ICD-10-CM

## 2016-12-05 DIAGNOSIS — I4891 Unspecified atrial fibrillation: Secondary | ICD-10-CM | POA: Diagnosis not present

## 2016-12-05 LAB — POCT INR: INR: 1.6

## 2016-12-06 ENCOUNTER — Ambulatory Visit (INDEPENDENT_AMBULATORY_CARE_PROVIDER_SITE_OTHER): Payer: Medicare Other | Admitting: Cardiology

## 2016-12-06 ENCOUNTER — Encounter: Payer: Self-pay | Admitting: Cardiology

## 2016-12-06 VITALS — BP 110/78 | HR 51 | Ht 69.0 in | Wt 190.0 lb

## 2016-12-06 DIAGNOSIS — I129 Hypertensive chronic kidney disease with stage 1 through stage 4 chronic kidney disease, or unspecified chronic kidney disease: Secondary | ICD-10-CM

## 2016-12-06 DIAGNOSIS — I25118 Atherosclerotic heart disease of native coronary artery with other forms of angina pectoris: Secondary | ICD-10-CM | POA: Diagnosis not present

## 2016-12-06 DIAGNOSIS — I2119 ST elevation (STEMI) myocardial infarction involving other coronary artery of inferior wall: Secondary | ICD-10-CM

## 2016-12-06 DIAGNOSIS — N183 Chronic kidney disease, stage 3 unspecified: Secondary | ICD-10-CM

## 2016-12-06 DIAGNOSIS — I4891 Unspecified atrial fibrillation: Secondary | ICD-10-CM | POA: Diagnosis not present

## 2016-12-06 DIAGNOSIS — I10 Essential (primary) hypertension: Secondary | ICD-10-CM

## 2016-12-06 MED ORDER — AMIODARONE HCL 200 MG PO TABS: 200.0000 mg | ORAL_TABLET | Freq: Every day | ORAL | 1 refills | Status: DC

## 2016-12-06 MED ORDER — METOPROLOL TARTRATE 25 MG PO TABS: 12.5000 mg | ORAL_TABLET | Freq: Two times a day (BID) | ORAL | 1 refills | Status: DC

## 2016-12-06 NOTE — Patient Instructions (Signed)
Your physician recommends that you schedule a follow-up appointment in: 4 WEEKS WITH DR. BRANCH  Your physician has recommended you make the following change in your medication:   CHANGE AMIODARONE 200 MG DAILY  CHANGE METOPROLOL 12.5 MG TWICE DAILY  Your physician recommends that you return for lab work CBC/MG/BMP  Your physician has requested that you have an echocardiogram. Echocardiography is a painless test that uses sound waves to create images of your heart. It provides your doctor with information about the size and shape of your heart and how well your heart's chambers and valves are working. This procedure takes approximately one hour. There are no restrictions for this procedure.   Thank you for choosing [facility]!!

## 2016-12-06 NOTE — Progress Notes (Signed)
Clinical Summary Dennis Paul is a 66 y.o.male seen as a hospital follow up, this is our first visit together.  1. CAD s/ CABG - patient admitted 11/2016 with STEMI.  - cath with severe 3 vessel disease as reported below. Referred for CABG - CABG 11/17/16 with LIMA-LAD, RIMA-PDA, SVG-OM,SVG-D2 - no echo in system, LVEF by LVgram 45%  - has had some recent chest pain. Sharp pain mid to left chest, 3/10 in severity. Mainly with rest. Mild SOB with it. Lasts just a few seconds. Not positional. Not related to food. - compliant with meds. No ACE-I I presume due to soft bp's, renal dysfunction.   2. HTN - compliant with meds  3. Postop afib - develoed after CABG - started amio and coumadin. Some prolonged QTc on amio, amio adjusted - no recent palpitations.  - occasional lightheadness with standing.   4. CKD III   5. 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   Past Medical History:  Diagnosis Date  . Asthma    had as a child, wheezes now w/ URI  . GERD (gastroesophageal reflux disease)   . Hiatal hernia   . HTN (hypertension)      No Known Allergies   Current Outpatient Prescriptions  Medication Sig Dispense Refill  . amiodarone (PACERONE) 200 MG tablet Take 1 tablet (200 mg total) by mouth 2 (two) times daily. 60 tablet 1  . aspirin EC 81 MG EC tablet Take 1 tablet (81 mg total) by mouth daily.    Marland Kitchen. atorvastatin (LIPITOR) 80 MG tablet Take 1 tablet (80 mg total) by mouth daily at 6 PM. 30 tablet 1  . furosemide (LASIX) 40 MG tablet Take 1 tablet (40 mg total) by mouth 2 (two) times daily. For 3 days, then once daily 33 tablet 0  . metoprolol tartrate (LOPRESSOR) 25 MG tablet Take 1 tablet (25 mg total) by mouth 2 (two) times daily. 60 tablet 1  . omeprazole (PRILOSEC) 20 MG capsule Take 20 mg by mouth daily.    . potassium chloride SA (K-DUR,KLOR-CON) 20 MEQ tablet Take 1 tablet (20 mEq total) by mouth daily. 30 tablet 0  . tamsulosin (FLOMAX) 0.4 MG  CAPS capsule Take 1 capsule (0.4 mg total) by mouth daily. 30 capsule 1  . warfarin (COUMADIN) 5 MG tablet Take 1 tablet (5 mg total) by mouth daily at 6 PM. As directed by coumadin clinic 100 tablet 1   No current facility-administered medications for this visit.      Past Surgical History:  Procedure Laterality Date  . CORONARY ARTERY BYPASS GRAFT N/A 11/17/2016   Procedure: CORONARY ARTERY BYPASS GRAFTING (CABG)x4   LIMA-LAD DVG-OM SVG-PD SVG-DIAG;  Surgeon: Dennis SlotSteven C Hendrickson, MD;  Location: [facility] OR;  Service: Open Heart Surgery;  Laterality: N/A;  . LEFT HEART CATH AND CORONARY ANGIOGRAPHY N/A 11/16/2016   Procedure: Left Heart Cath and Coronary Angiography;  Surgeon: Dennis M SwazilandJordan, MD;  [facility];  Service: Cardiovascular;  Laterality: N/A;  . TEE WITHOUT CARDIOVERSION N/A 11/17/2016   Procedure: TRANSESOPHAGEAL ECHOCARDIOGRAM (TEE);  Surgeon: Dennis SlotSteven C Hendrickson, MD;  [facility];  Service: Open Heart Surgery;  Laterality: N/A;     No Known Allergies    Family History  Problem Relation Age of Onset  . Hypertension Mother   . Hypertension Father      Social History Dennis Paul reports that he has never smoked. He has never used smokeless tobacco.  Dennis Paul reports that he does not drink alcohol.   Review of Systems CONSTITUTIONAL: No weight loss, fever, chills, weakness or fatigue.  HEENT: Eyes: No visual loss, blurred vision, double vision or yellow sclerae.No hearing loss, sneezing, congestion, runny nose or sore throat.  SKIN: No rash or itching.  CARDIOVASCULAR: per HPI RESPIRATORY: No shortness of breath, cough or sputum.  GASTROINTESTINAL: No anorexia, nausea, vomiting or diarrhea. No abdominal pain or blood.  GENITOURINARY: No burning on urination, no polyuria NEUROLOGICAL: No headache, dizziness, syncope, paralysis, ataxia, numbness or tingling in the extremities. No change in bowel or bladder control.  MUSCULOSKELETAL: No muscle, back pain,  joint pain or stiffness.  LYMPHATICS: No enlarged nodes. No history of splenectomy.  PSYCHIATRIC: No history of depression or anxiety.  ENDOCRINOLOGIC: No reports of sweating, cold or heat intolerance. No polyuria or polydipsia.  Marland Kitchen   Physical Examination There were no vitals filed for this visit. There were no vitals filed for this visit.  Gen: resting comfortably, no acute distress HEENT: no scleral icterus, pupils equal round and reactive, no palptable cervical adenopathy,  CV Resp: Clear to auscultation bilaterally GI: abdomen is soft, non-tender, non-distended, normal bowel sounds, no hepatosplenomegaly MSK: extremities are warm, no edema.  Skin: warm, no rash Neuro:  no focal deficits Psych: appropriate affect   Diagnostic Studies 11/2016 cath  Mid RCA lesion, 70 %stenosed.  RPDA lesion, 95 %stenosed.  Prox Cx to Mid Cx lesion, 50 %stenosed.  Prox LAD lesion, 90 %stenosed.  Mid LAD lesion, 50 %stenosed.  Ost 2nd Diag lesion, 90 %stenosed.  There is mild to moderate left ventricular systolic dysfunction.  LV end diastolic pressure is normal.   1. Severe 3 vessel obstructive CAD 2. Mild to moderate LV dysfunction 3. 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 The patient is now pain free and ST elevation is improved. He has complex 3 vessel disease with ulcerated plaque in the proximal LAD and bifurcation disease at the take off of a large diagonal Dennis Paul. Given the complexity of disease I think he would be best managed with CABG. Will maximize his medical therapy and consult CT surgery.     Assessment and Plan  1. CAD - s/p recent CABG. Some recent MSK pain at surgical site but no exertional chest pain - continue current meds - no echo in system, will order echo to evaluate LVEF in setting of CAD. Lvgram by cath suggests mild systolic dysfunction at 45%.  2. HTN - at goal, continue current meds  3. Post op afib - ekg in  clinic shows sinus brady at 55 - we will lower lopressor to 12.5mg  bid, lower amio to 200mg  daily. Likely be able to come off amio in the next few weeks - continue coumadin at this time, likely be able to come off if no afib recurrence in postop period. CHADS2Vasc score is 3.   4. CKD 3 - repeat labs   F/u 4 weekss.     Antoine Poche, M.D.

## 2016-12-11 DIAGNOSIS — I10 Essential (primary) hypertension: Secondary | ICD-10-CM | POA: Diagnosis not present

## 2016-12-12 ENCOUNTER — Ambulatory Visit (INDEPENDENT_AMBULATORY_CARE_PROVIDER_SITE_OTHER): Payer: Medicare Other | Admitting: *Deleted

## 2016-12-12 ENCOUNTER — Telehealth: Payer: Self-pay | Admitting: *Deleted

## 2016-12-12 DIAGNOSIS — I4891 Unspecified atrial fibrillation: Secondary | ICD-10-CM

## 2016-12-12 DIAGNOSIS — I10 Essential (primary) hypertension: Secondary | ICD-10-CM

## 2016-12-12 DIAGNOSIS — Z5181 Encounter for therapeutic drug level monitoring: Secondary | ICD-10-CM | POA: Diagnosis not present

## 2016-12-12 DIAGNOSIS — I2119 ST elevation (STEMI) myocardial infarction involving other coronary artery of inferior wall: Secondary | ICD-10-CM

## 2016-12-12 LAB — POCT INR: INR: 4.3

## 2016-12-12 NOTE — Telephone Encounter (Signed)
Pt and caregiver aware and voiced understanding - updated medication list and will mail pt lab orders.

## 2016-12-12 NOTE — Telephone Encounter (Signed)
-----   Message from Antoine PocheJonathan F Branch, MD sent at 12/11/2016 12:20 PM EST ----- Kidney function decreased since last check. Verify that he is still taking lasix 40mg  daily and if so have him stop taking, would only take if he had severe leg swelling. Also potassium is a little high, please stop taking potassium. Repeat BMET and Mg in 1 week   Dominga FerryJ Branch MD

## 2016-12-13 DIAGNOSIS — R338 Other retention of urine: Secondary | ICD-10-CM | POA: Diagnosis not present

## 2016-12-13 DIAGNOSIS — R339 Retention of urine, unspecified: Secondary | ICD-10-CM | POA: Diagnosis not present

## 2016-12-19 DIAGNOSIS — I10 Essential (primary) hypertension: Secondary | ICD-10-CM | POA: Diagnosis not present

## 2016-12-21 ENCOUNTER — Telehealth: Payer: Self-pay | Admitting: *Deleted

## 2016-12-21 ENCOUNTER — Other Ambulatory Visit: Payer: Self-pay

## 2016-12-21 ENCOUNTER — Ambulatory Visit (INDEPENDENT_AMBULATORY_CARE_PROVIDER_SITE_OTHER): Payer: Medicare Other | Admitting: *Deleted

## 2016-12-21 ENCOUNTER — Ambulatory Visit (INDEPENDENT_AMBULATORY_CARE_PROVIDER_SITE_OTHER): Payer: Medicare Other

## 2016-12-21 DIAGNOSIS — I4891 Unspecified atrial fibrillation: Secondary | ICD-10-CM

## 2016-12-21 DIAGNOSIS — I2119 ST elevation (STEMI) myocardial infarction involving other coronary artery of inferior wall: Secondary | ICD-10-CM

## 2016-12-21 DIAGNOSIS — I25118 Atherosclerotic heart disease of native coronary artery with other forms of angina pectoris: Secondary | ICD-10-CM

## 2016-12-21 DIAGNOSIS — Z5181 Encounter for therapeutic drug level monitoring: Secondary | ICD-10-CM

## 2016-12-21 LAB — POCT INR: INR: 3.5

## 2016-12-21 NOTE — Telephone Encounter (Signed)
-----   Message from Antoine PocheJonathan F Branch, MD sent at 12/21/2016  2:31 PM EDT ----- Kidney function improving, potassium has normalized  Dominga FerryJ Branch MD

## 2016-12-21 NOTE — Telephone Encounter (Signed)
Pt aware - routed to pcp  

## 2016-12-25 ENCOUNTER — Other Ambulatory Visit: Payer: Self-pay | Admitting: Thoracic Surgery (Cardiothoracic Vascular Surgery)

## 2016-12-25 ENCOUNTER — Telehealth: Payer: Self-pay | Admitting: *Deleted

## 2016-12-25 DIAGNOSIS — Z951 Presence of aortocoronary bypass graft: Secondary | ICD-10-CM

## 2016-12-25 NOTE — Telephone Encounter (Signed)
Pt aware - no pcp  

## 2016-12-25 NOTE — Telephone Encounter (Signed)
-----   Message from Antoine PocheJonathan F Branch, MD sent at 12/25/2016  2:08 PM EDT ----- Echo looks good, normal heart function   Dominga FerryJ Branch MD

## 2016-12-26 ENCOUNTER — Encounter: Payer: Self-pay | Admitting: Thoracic Surgery (Cardiothoracic Vascular Surgery)

## 2016-12-26 ENCOUNTER — Ambulatory Visit (INDEPENDENT_AMBULATORY_CARE_PROVIDER_SITE_OTHER): Payer: Self-pay | Admitting: Thoracic Surgery (Cardiothoracic Vascular Surgery)

## 2016-12-26 ENCOUNTER — Ambulatory Visit
Admission: RE | Admit: 2016-12-26 | Discharge: 2016-12-26 | Disposition: A | Discharge status: Home / Self Care | Payer: Medicare Other | Source: Ambulatory Visit | Attending: Thoracic Surgery (Cardiothoracic Vascular Surgery) | Admitting: Thoracic Surgery (Cardiothoracic Vascular Surgery)

## 2016-12-26 VITALS — BP 108/71 | HR 54 | Resp 16 | Ht 69.0 in | Wt 190.0 lb

## 2016-12-26 DIAGNOSIS — R339 Retention of urine, unspecified: Secondary | ICD-10-CM

## 2016-12-26 DIAGNOSIS — R0602 Shortness of breath: Secondary | ICD-10-CM | POA: Diagnosis not present

## 2016-12-26 DIAGNOSIS — Z951 Presence of aortocoronary bypass graft: Secondary | ICD-10-CM

## 2016-12-26 NOTE — Progress Notes (Signed)
[address]       [place] [id number]             [phone]                  Marja KaysJeremiah Pitt [facility] Medical Record #[id number] Date of Birth: 02/04/1951  Referring MD:No ref. provider found Primary Cardiology: Primary Care:No PCP Per Patient  Chief Complaint:  Follow Up Visit  PHYSICIAN:  Salvatore DecentSteven C. Dorris FetchHendrickson, M.D. DATE OF BIRTH:  DATE OF PROCEDURE:  11/17/2016 DATE OF DISCHARGE:                              OPERATIVE REPORT   PREOPERATIVE DIAGNOSIS:  Three-vessel coronary artery disease, status post ST-elevation myocardial infarction.  POSTOPERATIVE DIAGNOSIS:  Three-vessel coronary artery disease, status post ST-elevation myocardial infarction.  PROCEDURE:   Median sternotomy, extracorporeal circulation, Coronary artery bypass grafting x 4      Left internal mammary artery to LAD,      Right internal mammary artery to posterior descending,      Saphenous vein graft to obtuse marginal,      Saphenous vein graft to second diagonal Endoscopic vein harvest, both thighs.  SURGEON:  Salvatore DecentSteven C. Dorris FetchHendrickson, MD.  ASSISTANT:  Rowe ClackWayne E. Adren Dollins, PA-C.  ANESTHESIA:  General.  FINDINGS:  Transesophageal echocardiography showed some inferior apical hypokinesis, but overall preserved left ventricular function.  No significant valvular pathology.  Good quality target vessels.  Good quality mammary arteries.  Saphenous vein to OM- good quality. Saphenous vein to diagonal- fair quality.  Overall- vein poor quality. History of Present Illness:    Patient is a 66 year old gentleman status post the above described procedure. He is seen in the office on today's date for routine follow-up. He voices no significant complaints at this time. Overall he feels quite well with only some mild soreness which is amenable to Tylenol. At time of discharge he did require Foley catheter and has been followed by the urologist. He has been seen by cardiology and  is also being followed in the Coumadin clinic to adjust dosing for postoperative atrial fibrillation.      Zubrod Score: At the time of surgery this patient's most appropriate activity status/level should be described as: []     0    Normal activity, no symptoms []     1    Restricted in physical strenuous activity but ambulatory, able to do out light work []     2    Ambulatory and capable of self care, unable to do work activities, up and about                 >50 % of waking hours                                                                                   []     3    Only limited self care, in bed greater than 50% of waking hours []     4    Completely disabled, no self care, confined to bed or chair []   5    Moribund  History  Smoking Status  . Never Smoker  Smokeless Tobacco  . Never Used       No Known Allergies  Current Outpatient Prescriptions  Medication Sig Dispense Refill  . amiodarone (PACERONE) 200 MG tablet Take 1 tablet (200 mg total) by mouth daily. 90 tablet 1  . aspirin EC 81 MG EC tablet Take 1 tablet (81 mg total) by mouth daily.    Marland Kitchen atorvastatin (LIPITOR) 80 MG tablet Take 1 tablet (80 mg total) by mouth daily at 6 PM. 30 tablet 1  . finasteride (PROSCAR) 5 MG tablet Take 5 mg by mouth daily.    . furosemide (LASIX) 40 MG tablet Take 40 mg by mouth daily as needed.    . metoprolol tartrate (LOPRESSOR) 25 MG tablet Take 0.5 tablets (12.5 mg total) by mouth 2 (two) times daily. 45 tablet 1  . omeprazole (PRILOSEC) 20 MG capsule Take 20 mg by mouth daily.    . tamsulosin (FLOMAX) 0.4 MG CAPS capsule Take 1 capsule (0.4 mg total) by mouth daily. 30 capsule 1  . warfarin (COUMADIN) 5 MG tablet Take 1 tablet (5 mg total) by mouth daily at 6 PM. As directed by coumadin clinic 100 tablet 1   No current facility-administered medications for this visit.        Physical Exam: BP 108/71 (BP Location: Right Arm, Patient Position: Sitting, Cuff Size: Large)    Pulse (!) 54   Resp 16   Ht 5\' 9"  (1.753 m)   Wt 190 lb (86.2 kg)   SpO2 97% Comment: RA  BMI 28.06 kg/m   General appearance: alert, cooperative and no distress Heart: regular rate and rhythm Lungs: clear to auscultation bilaterally Abdomen: benign Extremities: trace edema Wound: incis healing well   Diagnostic Studies & Laboratory data:         Recent Radiology Findings: Dg Chest 2 View  Result Date: 12/26/2016 CLINICAL DATA:  Status post CABG. Some SOB and anterior chest discomfort. EXAM: CHEST  2 VIEW COMPARISON:  11/21/2016 FINDINGS: Previous median sternotomy and CABG procedure. The heart size appears normal. No pleural effusion or edema. No airspace opacities. IMPRESSION: No complications status post CABG procedure. Electronically Signed   By: Signa Kell M.D.   On: 12/26/2016 10:10      I have independently reviewed the above radiology findings and reviewed findings  with the patient.  Recent Labs: Lab Results  Component Value Date   WBC 7.1 11/23/2016   HGB 8.7 (L) 11/23/2016   HCT 25.7 (L) 11/23/2016   PLT 247 11/23/2016   GLUCOSE 95 11/24/2016   CHOL 189 11/17/2016   TRIG 115 11/17/2016   HDL 31 (L) 11/17/2016   LDLCALC 135 (H) 11/17/2016   ALT 25 11/16/2016   AST 35 11/16/2016   NA 139 11/24/2016   K 4.2 11/24/2016   CL 100 (L) 11/24/2016   CREATININE 1.39 (H) 11/24/2016   BUN 20 11/24/2016   CO2 27 11/24/2016   TSH 1.585 11/16/2016   INR 3.5 12/21/2016   HGBA1C 5.6 11/16/2016      Assessment / Plan:  The patient continues to do quite well. There is no specific current surgical difficulties. We discussed routine postoperative activity progression including driving. We will see him again in the office on a when necessary basis for any surgically related needs or at request. He will continue to be followed by Dr. Wyline Mood with cardiology as well as his urologist.  Ladarryl Wrage E 12/26/2016 10:32 AM

## 2016-12-26 NOTE — Patient Instructions (Signed)
Discussed routine activity progression and further follow-up with cardiologist and urologist. Encouraged to do cardiac rehabilitation which [place].

## 2017-01-04 DIAGNOSIS — R338 Other retention of urine: Secondary | ICD-10-CM | POA: Diagnosis not present

## 2017-01-09 ENCOUNTER — Ambulatory Visit (INDEPENDENT_AMBULATORY_CARE_PROVIDER_SITE_OTHER): Payer: Medicare Other | Admitting: *Deleted

## 2017-01-09 ENCOUNTER — Ambulatory Visit (INDEPENDENT_AMBULATORY_CARE_PROVIDER_SITE_OTHER): Payer: Medicare Other | Admitting: Cardiology

## 2017-01-09 ENCOUNTER — Encounter: Payer: Self-pay | Admitting: Cardiology

## 2017-01-09 VITALS — BP 107/73 | HR 41 | Ht 69.0 in | Wt 191.8 lb

## 2017-01-09 DIAGNOSIS — Z5181 Encounter for therapeutic drug level monitoring: Secondary | ICD-10-CM

## 2017-01-09 DIAGNOSIS — I10 Essential (primary) hypertension: Secondary | ICD-10-CM | POA: Diagnosis not present

## 2017-01-09 DIAGNOSIS — N183 Chronic kidney disease, stage 3 unspecified: Secondary | ICD-10-CM

## 2017-01-09 DIAGNOSIS — I2510 Atherosclerotic heart disease of native coronary artery without angina pectoris: Secondary | ICD-10-CM | POA: Diagnosis not present

## 2017-01-09 DIAGNOSIS — I2119 ST elevation (STEMI) myocardial infarction involving other coronary artery of inferior wall: Secondary | ICD-10-CM | POA: Diagnosis not present

## 2017-01-09 DIAGNOSIS — I4891 Unspecified atrial fibrillation: Secondary | ICD-10-CM

## 2017-01-09 LAB — POCT INR: INR: 2.4

## 2017-01-09 NOTE — Patient Instructions (Signed)
Your physician wants you to follow-up in: 4 MONTHS WITH DR [facility] You will receive a reminder letter in the mail two months in advance. If you don't receive a letter, please call our office to schedule the follow-up appointment.  Your physician has recommended you make the following change in your medication:   STOP AMIODARONE   STOP COUMADIN   STOP METOPROLOL   Thank you for choosing [facility]!!

## 2017-01-09 NOTE — Progress Notes (Signed)
Clinical Summary Dennis Paul is a 66 y.o.male seen today for follow up of the following medical problems.   1. CAD s/ CABG - patient admitted 11/2016 with STEMI.  - cath with severe 3 vessel disease as reported below. Referred for CABG - CABG 11/17/16 with LIMA-LAD, RIMA-PDA, SVG-OM,SVG-D2 - echo 12/2016 LVEF 55-60% -No ACE-I I presume due to soft bp's, renal dysfunction.    - denies any recent chest pain, still with some chest soreness - compliant with meds. Only needs lasix about once a week. - walk a mile yesterday without troubles. Not interested in cardiac rehab due to cost.    2. HTN - compliant with meds  3. Postop afib - develoed after CABG - having some lightheadedness/dizziness, worst later in the day. Mainly with standing.   4. CKD III   5. 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  Past Medical History:  Diagnosis Date  . Asthma    had as a child, wheezes now w/ URI  . GERD (gastroesophageal reflux disease)   . Hiatal hernia   . HTN (hypertension)      No Known Allergies   Current Outpatient Prescriptions  Medication Sig Dispense Refill  . amiodarone (PACERONE) 200 MG tablet Take 1 tablet (200 mg total) by mouth daily. 90 tablet 1  . aspirin EC 81 MG EC tablet Take 1 tablet (81 mg total) by mouth daily.    Marland Kitchen atorvastatin (LIPITOR) 80 MG tablet Take 1 tablet (80 mg total) by mouth daily at 6 PM. 30 tablet 1  . finasteride (PROSCAR) 5 MG tablet Take 5 mg by mouth daily.    . furosemide (LASIX) 40 MG tablet Take 40 mg by mouth daily as needed.    . metoprolol tartrate (LOPRESSOR) 25 MG tablet Take 0.5 tablets (12.5 mg total) by mouth 2 (two) times daily. 45 tablet 1  . omeprazole (PRILOSEC) 20 MG capsule Take 20 mg by mouth daily.    . tamsulosin (FLOMAX) 0.4 MG CAPS capsule Take 1 capsule (0.4 mg total) by mouth daily. 30 capsule 1  . warfarin (COUMADIN) 5 MG tablet Take 1 tablet (5 mg total) by mouth daily at 6 PM. As directed by  coumadin clinic 100 tablet 1   No current facility-administered medications for this visit.      Past Surgical History:  Procedure Laterality Date  . CORONARY ARTERY BYPASS GRAFT N/A 11/17/2016   Procedure: CORONARY ARTERY BYPASS GRAFTING (CABG)x4   LIMA-LAD DVG-OM SVG-PD SVG-DIAG;  Surgeon: Loreli Slot, MD;  Location: [facility] OR;  Service: Open Heart Surgery;  Laterality: N/A;  . LEFT HEART CATH AND CORONARY ANGIOGRAPHY N/A 11/16/2016   Procedure: Left Heart Cath and Coronary Angiography;  Surgeon: Peter M Swaziland, MD;  Location: [facility] INVASIVE CV LAB;  Service: Cardiovascular;  Laterality: N/A;  . TEE WITHOUT CARDIOVERSION N/A 11/17/2016   Procedure: TRANSESOPHAGEAL ECHOCARDIOGRAM (TEE);  Surgeon: Loreli Slot, MD;  Location[facility];  Service: Open Heart Surgery;  Laterality: N/A;     No Known Allergies    Family History  Problem Relation Age of Onset  . Hypertension Mother   . Hypertension Father      Social History Mr. Bernath reports that he has never smoked. He has never used smokeless tobacco. Mr. Evilsizer reports that he does not drink alcohol.   Review of Systems CONSTITUTIONAL: No weight loss, fever, chills, weakness or fatigue.  HEENT: Eyes: No visual loss, blurred vision, double vision or  yellow sclerae.No hearing loss, sneezing, congestion, runny nose or sore throat.  SKIN: No rash or itching.  CARDIOVASCULAR: per hpi RESPIRATORY: No shortness of breath, cough or sputum.  GASTROINTESTINAL: No anorexia, nausea, vomiting or diarrhea. No abdominal pain or blood.  GENITOURINARY: No burning on urination, no polyuria NEUROLOGICAL: occasional dizziness MUSCULOSKELETAL: No muscle, back pain, joint pain or stiffness.  LYMPHATICS: No enlarged nodes. No history of splenectomy.  PSYCHIATRIC: No history of depression or anxiety.  ENDOCRINOLOGIC: No reports of sweating, cold or heat intolerance. No polyuria or polydipsia.  Marland Kitchen   Physical Examination Vitals:   01/09/17 0811   BP: 107/73  Pulse: (!) 41   Vitals:   01/09/17 0811  Weight: 191 lb 12.8 oz (87 kg)  Height:  (1.753 m)    Gen: resting comfortably, no acute distress HEENT: no scleral icterus, pupils equal round and reactive, no palptable cervical adenopathy,  CV: RRR, no m/r/g, no jvd Resp: Clear to auscultation bilaterally GI: abdomen is soft, non-tender, non-distended, normal bowel sounds, no hepatosplenomegaly MSK: extremities are warm, no edema.  Skin: warm, no rash Neuro:  no focal deficits Psych: appropriate affect   Diagnostic Studies 11/2016 cath  Mid RCA lesion, 70 %stenosed.  RPDA lesion, 95 %stenosed.  Prox Cx to Mid Cx lesion, 50 %stenosed.  Prox LAD lesion, 90 %stenosed.  Mid LAD lesion, 50 %stenosed.  Ost 2nd Diag lesion, 90 %stenosed.  There is mild to moderate left ventricular systolic dysfunction.  LV end diastolic pressure is normal.  1. Severe 3 vessel obstructive CAD 2. Mild to moderate LV dysfunction 3. 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 The patient is now pain free and ST elevation is improved. He has complex 3 vessel disease with ulcerated plaque in the proximal LAD and bifurcation disease at the take off of a large diagonal Kameran Lallier. Given the complexity of disease I think he would be best managed with CABG. Will maximize his medical therapy and consult CT surgery.    12/2016 echo Study Conclusions  - Left ventricle: The cavity size was normal. Wall thickness was   normal. Systolic function was normal. The estimated ejection   fraction was in the range of 55% to 60%. Left ventricular   diastolic function parameters were normal. - Regional wall motion abnormality: Mild hypokinesis of the mid   anteroseptal myocardium. - Aortic valve: Mildly calcified annulus. Trileaflet; mildly   thickened leaflets. There was mild regurgitation. Valve area   (VTI): 3.04 cm^2. Valve area (Vmax): 3.23 cm^2. Valve area    (Vmean): 2.67 cm^2. - Mitral valve: Mildly calcified annulus. Mildly thickened leaflets   . There was mild regurgitation. - Technically adequate study.  Assessment and Plan  1. CAD - s/p recent CABG.No recent chest pain.  - continue current meds -   2. HTN He is  at goal, continue current meds  3. Post op afib - ekg in clinic shows sinus bradycardia. He does have some dizzienss at times - we will d/c amiodarone and lopressor. Discontinue coumadin as he has had no afib recurrence.   4. CKD 3 - continue to monitor labs.        Antoine Poche, M.D.

## 2017-01-15 DIAGNOSIS — R338 Other retention of urine: Secondary | ICD-10-CM | POA: Diagnosis not present

## 2017-01-17 ENCOUNTER — Telehealth: Payer: Self-pay

## 2017-01-17 NOTE — Telephone Encounter (Signed)
Patient needs surgical clearance for prostate surgery. They need patient to hold asa for 5 days Contact PAM Phone number: [phone] ext [phone] Fax number: [phone]

## 2017-01-18 ENCOUNTER — Other Ambulatory Visit: Payer: Self-pay | Admitting: *Deleted

## 2017-01-18 MED ORDER — ATORVASTATIN CALCIUM 80 MG PO TABS: 80.0000 mg | ORAL_TABLET | Freq: Every day | ORAL | 1 refills | Status: AC

## 2017-01-18 NOTE — Telephone Encounter (Signed)
Can we contact the urology office and have them forward his clinic note and indication for surgery. Patient had a heart attack and bypass surgery just 2 months ago. We need to know the indication, type, and urgency of the proposed surgery.   [name] MD

## 2017-01-18 NOTE — Telephone Encounter (Signed)
Pt came by requesting we fax atorvastatin to [facility] - printed rx and faxed as requested

## 2017-01-22 ENCOUNTER — Other Ambulatory Visit: Payer: Self-pay | Admitting: Surgical

## 2017-01-24 ENCOUNTER — Telehealth: Payer: Self-pay | Admitting: Cardiology

## 2017-01-24 NOTE — Telephone Encounter (Signed)
Detailed message left on voice mail that the clearance form had been faxed to the [place] office as Dr. Wyline Mood is not in the [place] office this week.  Will get form back to them as soon as it is signed off on.

## 2017-01-24 NOTE — Telephone Encounter (Signed)
Pam with [facility]   # [phone] ext [phone] called in regards to a pre op clearance Form that was faxed to our office on 01/18/17.  Please verify and call Pam.

## 2017-01-26 ENCOUNTER — Other Ambulatory Visit: Payer: Self-pay | Admitting: Surgical

## 2017-01-27 ENCOUNTER — Other Ambulatory Visit: Payer: Self-pay | Admitting: Cardiology

## 2017-01-29 ENCOUNTER — Other Ambulatory Visit: Payer: Self-pay | Admitting: Surgical

## 2017-02-01 ENCOUNTER — Ambulatory Visit: Payer: Self-pay | Admitting: *Deleted

## 2017-02-01 DIAGNOSIS — Z5181 Encounter for therapeutic drug level monitoring: Secondary | ICD-10-CM

## 2017-02-05 ENCOUNTER — Other Ambulatory Visit: Payer: Self-pay | Admitting: Urology

## 2017-02-13 DIAGNOSIS — R338 Other retention of urine: Secondary | ICD-10-CM | POA: Diagnosis not present

## 2017-02-19 NOTE — Patient Instructions (Addendum)
Dennis KaysJeremiah Paul  02/19/2017   Your procedure is scheduled on: 02/27/2017    [facility] Main  Entrance [place]  elevators to 3rd floor to  Short Stay Center at    [phone].     Call this number if you have problems the morning of surgery [phone]    Remember: ONLY 1 PERSON MAY GO WITH YOU TO SHORT STAY TO GET  READY MORNING OF YOUR SURGERY.  Do not eat food or drink liquids :After Midnight.     Take these medicines the morning of surgery with A SIP OF WATER: Omeprazole ( Prilosec), flomax, finasteride                                You may not have any metal on your body including hair pins and              piercings  Do not wear jewelry,  lotions, powders or perfumes, deodorant               Men may shave face and neck.   Do not bring valuables to the hospital. [place] IS NOT             RESPONSIBLE   FOR VALUABLES.  Contacts, dentures or bridgework may not be worn into surgery. .     Patients discharged the day of surgery will not be allowed to drive home.  Name and phone number of your driver:                Please read over the following fact sheets you were given: _____________________________________________________________________             [facility] - Preparing for Surgery Before surgery, you can play an important role.  Because skin is not sterile, your skin needs to be as free of germs as possible.  You can reduce the number of germs on your skin by washing with CHG (chlorahexidine gluconate) soap before surgery.  CHG is an antiseptic cleaner which kills germs and bonds with the skin to continue killing germs even after washing. Please DO NOT use if you have an allergy to CHG or antibacterial soaps.  If your skin becomes reddened/irritated stop using the CHG and inform your nurse when you arrive at Short Stay. Do not shave (including legs and underarms) for at least 48 hours prior to the first CHG shower.  You may shave  your face/neck. Please follow these instructions carefully:  1.  Shower with CHG Soap the night before surgery and the  morning of Surgery.  2.  If you choose to wash your hair, wash your hair first as usual with your  normal  shampoo.  3.  After you shampoo, rinse your hair and body thoroughly to remove the  shampoo.                           4.  Use CHG as you would any other liquid soap.  You can apply chg directly  to the skin and wash                       Gently with a scrungie or clean washcloth.  5.  Apply the CHG Soap to your body ONLY FROM THE  NECK DOWN.   Do not use on face/ open                           Wound or open sores. Avoid contact with eyes, ears mouth and genitals (private parts).                       Wash face,  Genitals (private parts) with your normal soap.             6.  Wash thoroughly, paying special attention to the area where your surgery  will be performed.  7.  Thoroughly rinse your body with warm water from the neck down.  8.  DO NOT shower/wash with your normal soap after using and rinsing off  the CHG Soap.                9.  Pat yourself dry with a clean towel.            10.  Wear clean pajamas.            11.  Place clean sheets on your bed the night of your first shower and do not  sleep with pets. Day of Surgery : Do not apply any lotions/deodorants the morning of surgery.  Please wear clean clothes to the hospital/surgery center.  FAILURE TO FOLLOW THESE INSTRUCTIONS MAY RESULT IN THE CANCELLATION OF YOUR SURGERY PATIENT SIGNATURE_________________________________  NURSE SIGNATURE__________________________________  ________________________________________________________________________

## 2017-02-26 ENCOUNTER — Encounter (HOSPITAL_COMMUNITY): Payer: Self-pay

## 2017-02-26 ENCOUNTER — Encounter (HOSPITAL_COMMUNITY)
Admission: RE | Admit: 2017-02-26 | Discharge: 2017-02-26 | Disposition: A | Discharge status: Home / Self Care | Payer: Medicare Other | Source: Ambulatory Visit | Attending: Urology | Admitting: Urology

## 2017-02-26 DIAGNOSIS — Z87891 Personal history of nicotine dependence: Secondary | ICD-10-CM | POA: Diagnosis not present

## 2017-02-26 DIAGNOSIS — R338 Other retention of urine: Secondary | ICD-10-CM | POA: Diagnosis not present

## 2017-02-26 DIAGNOSIS — N401 Enlarged prostate with lower urinary tract symptoms: Secondary | ICD-10-CM | POA: Diagnosis not present

## 2017-02-26 DIAGNOSIS — Z7901 Long term (current) use of anticoagulants: Secondary | ICD-10-CM | POA: Diagnosis not present

## 2017-02-26 DIAGNOSIS — Z951 Presence of aortocoronary bypass graft: Secondary | ICD-10-CM | POA: Diagnosis not present

## 2017-02-26 DIAGNOSIS — Z7982 Long term (current) use of aspirin: Secondary | ICD-10-CM | POA: Diagnosis not present

## 2017-02-26 HISTORY — DX: Cardiac arrhythmia, unspecified: I49.9

## 2017-02-26 HISTORY — DX: Unspecified osteoarthritis, unspecified site: M19.90

## 2017-02-26 HISTORY — DX: Acute myocardial infarction, unspecified: I21.9

## 2017-02-26 HISTORY — DX: Nausea with vomiting, unspecified: Z98.890

## 2017-02-26 HISTORY — DX: Atherosclerotic heart disease of native coronary artery without angina pectoris: I25.10

## 2017-02-26 HISTORY — DX: Personal history of other diseases of the digestive system: Z87.19

## 2017-02-26 HISTORY — DX: Other specified postprocedural states: R11.2

## 2017-02-26 LAB — CBC
HCT: 36.7 % — ABNORMAL LOW (ref 39.0–52.0)
HEMOGLOBIN: 11.6 g/dL — AB (ref 13.0–17.0)
MCH: 28.4 pg (ref 26.0–34.0)
MCHC: 31.6 g/dL (ref 30.0–36.0)
MCV: 89.7 fL (ref 78.0–100.0)
PLATELETS: 212 10*3/uL (ref 150–400)
RBC: 4.09 MIL/uL — AB (ref 4.22–5.81)
RDW: 13 % (ref 11.5–15.5)
WBC: 5.9 10*3/uL (ref 4.0–10.5)

## 2017-02-26 LAB — BASIC METABOLIC PANEL
ANION GAP: 6 (ref 5–15)
BUN: 20 mg/dL (ref 6–20)
CALCIUM: 9 mg/dL (ref 8.9–10.3)
CO2: 27 mmol/L (ref 22–32)
CREATININE: 1.47 mg/dL — AB (ref 0.61–1.24)
Chloride: 107 mmol/L (ref 101–111)
GFR calc Af Amer: 55 mL/min — ABNORMAL LOW (ref >60)
GFR, EST NON AFRICAN AMERICAN: 48 mL/min — AB (ref >60)
Glucose, Bld: 108 mg/dL — ABNORMAL HIGH (ref 65–99)
Potassium: 4.7 mmol/L (ref 3.5–5.1)
SODIUM: 140 mmol/L (ref 135–145)

## 2017-02-26 MED ORDER — CEFAZOLIN SODIUM-DEXTROSE 2-4 GM/100ML-% IV SOLN: 2.0000 g | INTRAVENOUS | Status: DC

## 2017-02-26 NOTE — Progress Notes (Signed)
01/09/17-LOV- cardiology in epic  EKG-12/06/16- on chart  ECHO-12/21/16- epic  Cath-11/16/16-epic  CXR-12/26/16-epic

## 2017-02-26 NOTE — Progress Notes (Signed)
Cardiology clearance dated 01/18/17- placed on chart from Dr Dina RichJonathan Branch.

## 2017-02-26 NOTE — Progress Notes (Signed)
BMP done 5.21.18 faxed via epic to Dr Mena GoesEskridge.

## 2017-02-26 NOTE — Progress Notes (Signed)
Called and requested clearance from cardiology for [facility].

## 2017-02-26 NOTE — H&P (Signed)
Office Visit Report     01/15/2017   --------------------------------------------------------------------------------   Dennis KaysJeremiah Paul  MRN: [id number]  PRIMARY CARE:  Gerrit Friendsobert M. Malen GauzeFoster, MD  DOB: [birth year], 66 year old Male  REFERRING:  Doyne KeelMatthew R. Turrell Severt, MD  SSN: [id number]  PROVIDER:  Jerilee FieldMatthew Federico Maiorino, M.D.    TREATING:  Vianne BullsBree Fleck    LOCATION:  [facility] [phone]   --------------------------------------------------------------------------------   CC: I have urinary retention.  HPI: Dennis KaysJeremiah Paul is a 66 year-old male established patient who is here for urinary retention.  Patient with history of urinary retention. He developed retention after CABG on 11/23/16. He failed a voiding trial. He had up to 800 mL in his bladder. He was initially started on tamsulosin and then finasteride was added. Urodynamics revealed a capacity of 1230 mL with mild instability. There was decreased sensation. He voided with pressures of 26-38 cm of water at a flow of 1.7 cc. Cystoscopy revealed mildly enlarged prostate.   He returns today for voiding trial. He states that his catheter has been draining well and he has had no problems with it. He denies hematuria, dysuria, flank pain, suprapubic pain, or fever.    His urinary retention is being treated with flomax and proscar. Patient denies foley catheter, suprapubic tube, intemittent catheterization, hytrin, cardura, uroxatrol, rapaflo, and avodart.     ALLERGIES: No Allergies    MEDICATIONS: Finasteride 5 mg tablet 1 tablet PO Daily  Omeprazole  Aspirin Ec  Atorvastatin Calcium 80 mg tablet  Flomax     GU PSH: Complex cystometrogram, w/ void pressure and urethral pressure profile studies, any technique - 12/13/2016 Complex Uroflow - 12/13/2016 Cystoscopy - 01/04/2017 Emg surf Electrd - 12/13/2016 Inject For cystogram - 12/13/2016 Intrabd voidng Press - 12/13/2016    NON-GU PSH: No Non-GU PSH    GU PMH: Urinary Retention -  01/04/2017, (Chronic), Instructed if unable to void in 6 hrs will need to go ER for foley replacement. Continue with daily Tamsulosin. F/U in 3 days for PVR. If he fails voiding trail will need UDS and f/u cysto w/Dr. Mena GoesEskridge, - 11/28/2016    NON-GU PMH: No Non-GU PMH    FAMILY HISTORY: No Family History    SOCIAL HISTORY: Marital Status: Married Current Smoking Status: Patient does not smoke anymore.   Tobacco Use Assessment Completed: Used Tobacco in last 30 days? Does not use smokeless tobacco. Does drink.  Does not use drugs. Drinks 3 caffeinated drinks per day. Has not had a blood transfusion.    REVIEW OF SYSTEMS:    GU Review Male:   Patient denies frequent urination, hard to postpone urination, burning/ pain with urination, get up at night to urinate, leakage of urine, stream starts and stops, trouble starting your stream, have to strain to urinate , erection problems, and penile pain.  Gastrointestinal (Upper):   Patient reports indigestion/ heartburn. Patient denies nausea and vomiting.  Gastrointestinal (Lower):   Patient denies diarrhea and constipation.  Constitutional:   Patient denies fever, night sweats, weight loss, and fatigue.  Skin:   Patient denies skin rash/ lesion and itching.  Eyes:   Patient denies blurred vision and double vision.  Ears/ Nose/ Throat:   Patient denies sore throat and sinus problems.  Hematologic/Lymphatic:   Patient denies swollen glands and easy bruising.  Cardiovascular:   Patient reports leg swelling. Patient denies chest pains.  Respiratory:   Patient denies cough and shortness of breath.  Endocrine:   Patient denies excessive thirst.  Musculoskeletal:   Patient denies back pain and joint pain.  Neurological:   Patient denies headaches and dizziness.  Psychologic:   Patient denies depression and anxiety.   VITAL SIGNS: None   MULTI-SYSTEM PHYSICAL EXAMINATION:    Constitutional: Well-nourished. No physical deformities. Normally  developed. Good grooming.  Skin: No paleness, no jaundice, no cyanosis. No lesion, no ulcer, no rash.  Neurologic / Psychiatric: Oriented to time, oriented to place, oriented to person. No depression, no anxiety, no agitation.     PAST DATA REVIEWED:  Source Of History:  Patient  Records Review:   Previous Patient Records  Urodynamics Review:   Review Urodynamics Tests   PROCEDURES:           Voiding Trial - 16109  Instilled Volume: 800 cc  Voided Volume: 20 cc         Catheter / SP Tube - 51702 Simple Foley Catheterization  A 16 French Foley catheter was inserted into the bladder using sterile technique. The patient was taught routine catheter care. A leg bag was connected.   ASSESSMENT:      ICD-10 Details  1 GU:   Urinary Retention - R33.8    PLAN:            Medications Stop Meds: Metoprolol Succinate 25 mg tablet, extended release 24 hr  Discontinue: 01/15/2017  - Reason: The medication cycle was completed.  Warfarin Sodium  Discontinue: 01/15/2017  - Reason: The medication cycle was completed.  Amiodarone Hcl 200 mg tablet  Discontinue: 01/15/2017  - Reason: The medication cycle was completed.            Schedule Return Visit/Planned Activity: 2 Weeks - Office Visit, Follow up MD          Document Letter(s):  Created for Patient: Clinical Summary         Notes:   I will proceed with voiding trial today. Approximately 800 mL of fluid was instilled into the patient's bladder. Patient was unable to void in the office today. He was instructed to hydrate adequately. He does live about one hour away and was instructed to stay locally for the next several hours. Strict return to the clinic or emergency department was given if he was unable to void in 6-8 hours or experiences painful inability to void. Patient returned to clinic this afternoon with painful inability to void. Patient did not want to learn CIC at this time and elected from catheter replacement.  Approximately 1300 mL of urine was drained. A urine culture was obtained and sent to the lab. Based on the last note, Urolift was discussed with the patient. He would like to consider moving forward with this given failure of this voiding trial. I will discuss the case with the patient's urologist and advise them of his recommendation. Given recent 4 vessel CABG, he would likely need cardiology clearance prior to any procedure. He may return, in the meantime, with new or progressive symptoms.     ** Signed by Vianne Bulls on 01/15/17 at 5:08 PM (EDT)*     The information contained in this medical record document is considered private and confidential patient information. This information can only be used for the medical diagnosis and/or medical services that are being provided by the patient's selected caregivers. This information can only be distributed outside of the patient's care if the patient agrees and signs waivers of authorization for this information to be sent to an outside source or route.  Add: Urine  Cx + for enterococcus - pt started on doxycycline and I changed pre-op abx to Levaquin.

## 2017-02-27 ENCOUNTER — Ambulatory Visit (HOSPITAL_COMMUNITY): Payer: Medicare Other | Admitting: Registered Nurse

## 2017-02-27 ENCOUNTER — Encounter (HOSPITAL_COMMUNITY): Payer: Self-pay | Admitting: *Deleted

## 2017-02-27 ENCOUNTER — Ambulatory Visit (HOSPITAL_COMMUNITY)
Admission: RE | Admit: 2017-02-27 | Discharge: 2017-02-27 | Disposition: A | Discharge status: Home / Self Care | Payer: Medicare Other | Source: Ambulatory Visit | Attending: Urology | Admitting: Urology

## 2017-02-27 ENCOUNTER — Encounter (HOSPITAL_COMMUNITY)
Admission: RE | Disposition: A | Discharge status: Home / Self Care | Payer: Self-pay | Source: Ambulatory Visit | Attending: Urology

## 2017-02-27 DIAGNOSIS — Z7901 Long term (current) use of anticoagulants: Secondary | ICD-10-CM | POA: Insufficient documentation

## 2017-02-27 DIAGNOSIS — Z87891 Personal history of nicotine dependence: Secondary | ICD-10-CM | POA: Diagnosis not present

## 2017-02-27 DIAGNOSIS — Z7982 Long term (current) use of aspirin: Secondary | ICD-10-CM | POA: Insufficient documentation

## 2017-02-27 DIAGNOSIS — I4891 Unspecified atrial fibrillation: Secondary | ICD-10-CM | POA: Diagnosis not present

## 2017-02-27 DIAGNOSIS — R338 Other retention of urine: Secondary | ICD-10-CM | POA: Insufficient documentation

## 2017-02-27 DIAGNOSIS — N401 Enlarged prostate with lower urinary tract symptoms: Secondary | ICD-10-CM | POA: Insufficient documentation

## 2017-02-27 DIAGNOSIS — I10 Essential (primary) hypertension: Secondary | ICD-10-CM | POA: Diagnosis not present

## 2017-02-27 DIAGNOSIS — Z951 Presence of aortocoronary bypass graft: Secondary | ICD-10-CM | POA: Diagnosis not present

## 2017-02-27 HISTORY — PX: CYSTOSCOPY WITH INSERTION OF UROLIFT: SHX6678

## 2017-02-27 SURGERY — CYSTOSCOPY WITH INSERTION OF UROLIFT
Anesthesia: General

## 2017-02-27 MED ORDER — EPHEDRINE SULFATE-NACL 50-0.9 MG/10ML-% IV SOSY
PREFILLED_SYRINGE | INTRAVENOUS | Status: DC | PRN
  Administered 2017-02-27: 10 mg via INTRAVENOUS
  Administered 2017-02-27 (×2): 5 mg via INTRAVENOUS

## 2017-02-27 MED ORDER — MIDAZOLAM HCL 5 MG/5ML IJ SOLN
INTRAMUSCULAR | Status: DC | PRN
  Administered 2017-02-27: 2 mg via INTRAVENOUS

## 2017-02-27 MED ORDER — STERILE WATER FOR IRRIGATION IR SOLN
Status: DC | PRN
  Administered 2017-02-27: 6000 mL

## 2017-02-27 MED ORDER — ONDANSETRON HCL 4 MG/2ML IJ SOLN
INTRAMUSCULAR | Status: AC
  Filled 2017-02-27: qty 2

## 2017-02-27 MED ORDER — LEVOFLOXACIN IN D5W 500 MG/100ML IV SOLN
500.0000 mg | Freq: Once | INTRAVENOUS | Status: AC
  Administered 2017-02-27: 500 mg via INTRAVENOUS
  Filled 2017-02-27: qty 100

## 2017-02-27 MED ORDER — DEXAMETHASONE SODIUM PHOSPHATE 10 MG/ML IJ SOLN
INTRAMUSCULAR | Status: AC
  Filled 2017-02-27: qty 1

## 2017-02-27 MED ORDER — KETOROLAC TROMETHAMINE 30 MG/ML IJ SOLN: 30.0000 mg | Freq: Once | INTRAMUSCULAR | Status: DC | PRN

## 2017-02-27 MED ORDER — BELLADONNA ALKALOIDS-OPIUM 16.2-60 MG RE SUPP
RECTAL | Status: AC
  Filled 2017-02-27: qty 1

## 2017-02-27 MED ORDER — MIDAZOLAM HCL 2 MG/2ML IJ SOLN
INTRAMUSCULAR | Status: AC
  Filled 2017-02-27: qty 2

## 2017-02-27 MED ORDER — FENTANYL CITRATE (PF) 100 MCG/2ML IJ SOLN
INTRAMUSCULAR | Status: DC | PRN
  Administered 2017-02-27: 50 ug via INTRAVENOUS

## 2017-02-27 MED ORDER — FENTANYL CITRATE (PF) 100 MCG/2ML IJ SOLN
25.0000 ug | INTRAMUSCULAR | Status: DC | PRN
Start: 2017-02-27 — End: 2017-02-27

## 2017-02-27 MED ORDER — LIDOCAINE 2% (20 MG/ML) 5 ML SYRINGE
INTRAMUSCULAR | Status: DC | PRN
  Administered 2017-02-27: 100 mg via INTRAVENOUS

## 2017-02-27 MED ORDER — PROPOFOL 10 MG/ML IV BOLUS
INTRAVENOUS | Status: DC | PRN
  Administered 2017-02-27: 170 mg via INTRAVENOUS

## 2017-02-27 MED ORDER — DEXAMETHASONE SODIUM PHOSPHATE 10 MG/ML IJ SOLN
INTRAMUSCULAR | Status: DC | PRN
  Administered 2017-02-27: 10 mg via INTRAVENOUS

## 2017-02-27 MED ORDER — PROPOFOL 10 MG/ML IV BOLUS
INTRAVENOUS | Status: AC
  Filled 2017-02-27: qty 20

## 2017-02-27 MED ORDER — PROMETHAZINE HCL 25 MG/ML IJ SOLN: 6.2500 mg | INTRAMUSCULAR | Status: DC | PRN

## 2017-02-27 MED ORDER — FENTANYL CITRATE (PF) 100 MCG/2ML IJ SOLN
INTRAMUSCULAR | Status: AC
  Filled 2017-02-27: qty 2

## 2017-02-27 MED ORDER — LACTATED RINGERS IV SOLN
INTRAVENOUS | Status: DC | PRN
  Administered 2017-02-27: 07:00:00 via INTRAVENOUS

## 2017-02-27 SURGICAL SUPPLY — 11 items
BAG URINE DRAINAGE (UROLOGICAL SUPPLIES) ×2 IMPLANT
BAG URO CATCHER STRL LF (MISCELLANEOUS) ×2 IMPLANT
CATH FOLEY 2WAY SLVR  5CC 16FR (CATHETERS) ×1
CATH FOLEY 2WAY SLVR 5CC 16FR (CATHETERS) ×1 IMPLANT
COVER SURGICAL LIGHT HANDLE (MISCELLANEOUS) ×2 IMPLANT
GLOVE BIO SURGEON STRL SZ7.5 (GLOVE) ×2 IMPLANT
GOWN STRL REUS W/TWL XL LVL3 (GOWN DISPOSABLE) ×4 IMPLANT
MANIFOLD NEPTUNE II (INSTRUMENTS) ×2 IMPLANT
PACK CYSTO (CUSTOM PROCEDURE TRAY) ×2 IMPLANT
SYSTEM UROLIFT (Male Continence) ×8 IMPLANT
TUBING CONNECTING 10 (TUBING) ×2 IMPLANT

## 2017-02-27 NOTE — Op Note (Signed)
Preoperative diagnosis: BPH, urinary retention Postoperative diagnosis: Same  Procedure: Cystoscopy, UroLift  Surgeon: Mena GoesEskridge  Anesthesia: Gen.  Indication for procedure: 66 year old white male with a history of BPH and urinary retention. He had obstructing prostate tissue visually on cystoscopy and was brought for UroLift.   Findings: On cystoscopy the urethra appeared normal, the prostatic urethra was obstructed by lateral lobes, there was a slightly high bladder neck but no significant median lobe component. There were no stones or foreign bodies in the bladder. The was moderate trabeculation and a good capacity bladder. The ureteral orifices were in their normal orthotopic position with clear efflux.  On exam under anesthesia the penis was uncircumcised without mass or lesion. The testicles were palpably normal. On DRE the prostate was smooth without hard area or nodule.  Description of procedure: After consent was obtained patient brought to the operating room. After adequate anesthesia he was placed in lithotomy position and prepped and draped in the usual sterile fashion. A timeout was performed to confirm the patient and procedure. The cystoscope was passed per urethra and the bladder inspected. The UroLift device was placed. I placed the first suture near the left base. I came in from the bladder neck about 2 cm and placed the first suture. I then placed the second device lining up the veru and at the left apex. Similarly, a right base and the right apex suture were placed. This created an open channel. The bladder and bladder neck was reinspected and none of the sutures or anchoring tabs were visible. This created an open channel. The bladder was filled with about 100 mL of fluid and the scope removed. Hemostasis was good. An exam under anesthesia was performed. The patient was awakened taken to recovery room in stable condition.   Complications: None   Blood loss: Minimal    Specimens: None   Drains: None

## 2017-02-27 NOTE — Anesthesia Postprocedure Evaluation (Signed)
Anesthesia Post Note  Patient: Dennis KaysJeremiah Collums Paul  Procedure(s) Performed: Procedure(s) (LRB): CYSTOSCOPY WITH INSERTION OF UROLIFT (N/A)  Patient location during evaluation: PACU Anesthesia Type: General Level of consciousness: awake and alert Pain management: pain level controlled Vital Signs Assessment: post-procedure vital signs reviewed and stable Respiratory status: spontaneous breathing, nonlabored ventilation, respiratory function stable and patient connected to nasal cannula oxygen Cardiovascular status: blood pressure returned to baseline and stable Postop Assessment: no signs of nausea or vomiting Anesthetic complications: no       Last Vitals:  Vitals:   02/27/17 0920 02/27/17 0931  BP: 114/63 116/71  Pulse: 64 60  Resp: 13 14  Temp:  36.2 C    Last Pain:  Vitals:   02/27/17 0521  TempSrc: Oral                 Hayle Parisi S

## 2017-02-27 NOTE — Transfer of Care (Signed)
Immediate Anesthesia Transfer of Care Note  Patient: Dennis Paul  Procedure(s) Performed: Procedure(s): CYSTOSCOPY WITH INSERTION OF UROLIFT (N/A)  Patient Location: PACU  Anesthesia Type:General  Level of Consciousness: awake, alert , oriented and patient cooperative  Airway & Oxygen Therapy: Patient Spontanous Breathing and Patient connected to face mask oxygen  Post-op Assessment: Report given to RN, Post -op Vital signs reviewed and stable and Patient moving all extremities  Post vital signs: Reviewed and stable  Last Vitals:  Vitals:   02/27/17 0521  BP: (!) 150/82  Pulse: (!) 59  Resp: 16  Temp: 36.4 C    Last Pain:  Vitals:   02/27/17 0521  TempSrc: Oral      Patients Stated Pain Goal: 3 (02/27/17 0532)  Complications: No apparent anesthesia complications

## 2017-02-27 NOTE — Discharge Instructions (Signed)
Cystoscopy, Care After Refer to this sheet in the next few weeks. These instructions provide you with information about caring for yourself after your procedure. Your health care provider may also give you more specific instructions. Your treatment has been planned according to current medical practices, but problems sometimes occur. Call your health care provider if you have any problems or questions after your procedure. What can I expect after the procedure? After the procedure, it is common to have:  Mild pain when you urinate. Pain should stop within a few minutes after you urinate. This may last for up to 1 week.  A small amount of blood in your urine for several days.  Feeling like you need to urinate but producing only a small amount of urine. Follow these instructions at home:   Medicines   Take over-the-counter and prescription medicines only as told by your health care provider.  If you were prescribed an antibiotic medicine, take it as told by your health care provider. Do not stop taking the antibiotic even if you start to feel better. General instructions    Return to your normal activities as told by your health care provider. Ask your health care provider what activities are safe for you.  Do not drive for 24 hours if you received a sedative.  Watch for any blood in your urine. If the amount of blood in your urine increases, call your health care provider.  Follow instructions from your health care provider about eating or drinking restrictions.  If a tissue sample was removed for testing (biopsy) during your procedure, it is your responsibility to get your test results. Ask your health care provider or the department performing the test when your results will be ready.  Drink enough fluid to keep your urine clear or pale yellow.  Keep all follow-up visits as told by your health care provider. This is important. Contact a health care provider if:  You have pain that  gets worse or does not get better with medicine, especially pain when you urinate.  You have difficulty urinating. Get help right away if:  You have more blood in your urine.  You have blood clots in your urine.  You have abdominal pain.  You have a fever or chills.  You are unable to urinate.  General Anesthesia, Adult, Care After These instructions provide you with information about caring for yourself after your procedure. Your health care provider may also give you more specific instructions. Your treatment has been planned according to current medical practices, but problems sometimes occur. Call your health care provider if you have any problems or questions after your procedure. What can I expect after the procedure? After the procedure, it is common to have:  Vomiting.  A sore throat.  Mental slowness. It is common to feel:  Nauseous.  Cold or shivery.  Sleepy.  Tired.  Sore or achy, even in parts of your body where you did not have surgery. Follow these instructions at home: For at least 24 hours after the procedure:   Do not:  Participate in activities where you could fall or become injured.  Drive.  Use heavy machinery.  Drink alcohol.  Take sleeping pills or medicines that cause drowsiness.  Make important decisions or sign legal documents.  Take care of children on your own.  Rest. Eating and drinking   If you vomit, drink water, juice, or soup when you can drink without vomiting.  Drink enough fluid to keep your urine  clear or pale yellow.  Make sure you have little or no nausea before eating solid foods.  Follow the diet recommended by your health care provider. General instructions   Have a responsible adult stay with you until you are awake and alert.  Return to your normal activities as told by your health care provider. Ask your health care provider what activities are safe for you.  Take over-the-counter and prescription  medicines only as told by your health care provider.  If you smoke, do not smoke without supervision.  Keep all follow-up visits as told by your health care provider. This is important. Contact a health care provider if:  You continue to have nausea or vomiting at home, and medicines are not helpful.  You cannot drink fluids or start eating again.  You cannot urinate after 8-12 hours.  You develop a skin rash.  You have fever.  You have increasing redness at the site of your procedure. Get help right away if:  You have difficulty breathing.  You have chest pain.  You have unexpected bleeding.  You feel that you are having a life-threatening or urgent problem. This information is not intended to replace advice given to you by your health care provider. Make sure you discuss any questions you have with your health care provider. Document Released: 01/01/2001 Document Revised: 02/28/2016 Document Reviewed: 09/09/2015 Elsevier Interactive Patient Education  2017 [employer]

## 2017-02-27 NOTE — Interval H&P Note (Signed)
History and Physical Interval Note:  02/27/2017 7:32 AM  Dennis Paul  has presented today for surgery, with the diagnosis of benign prostate hyperplasia  The various methods of treatment have been discussed with the patient and family. After consideration of risks, benefits and other options for treatment, the patient has consented to  Procedure(s): CYSTOSCOPY WITH INSERTION OF UROLIFT (N/A) as a surgical intervention .  The patient's history has been reviewed, patient examined, no change in status, stable for surgery.  I have reviewed the patient's chart and labs.  Questions were answered to the patient's satisfaction.  He's been well. Urine clear. No fever. He started the abx.    Dennis Paul

## 2017-02-27 NOTE — Anesthesia Procedure Notes (Signed)
Procedure Name: LMA Insertion Date/Time: 02/27/2017 7:51 AM Performed by: Jarvis NewcomerARMISTEAD, Fontaine Hehl A Pre-anesthesia Checklist: Patient identified, Emergency Drugs available, Suction available, Patient being monitored and Timeout performed Patient Re-evaluated:Patient Re-evaluated prior to inductionOxygen Delivery Method: Circle system utilized Preoxygenation: Pre-oxygenation with 100% oxygen Intubation Type: IV induction Ventilation: Mask ventilation without difficulty LMA: LMA with gastric port inserted LMA Size: 4.0 Number of attempts: 1 Placement Confirmation: positive ETCO2 and breath sounds checked- equal and bilateral Tube secured with: Tape Dental Injury: Teeth and Oropharynx as per pre-operative assessment

## 2017-02-27 NOTE — Anesthesia Preprocedure Evaluation (Signed)
Anesthesia Evaluation  Patient identified by MRN, date of birth, ID band Patient awake    Reviewed: Allergy & Precautions, NPO status , Patient's Chart, lab work & pertinent test results  Airway Mallampati: II  TM Distance: >3 FB Neck ROM: Full    Dental no notable dental hx.    Pulmonary neg pulmonary ROS, former smoker,    Pulmonary exam normal breath sounds clear to auscultation       Cardiovascular hypertension, + CAD, + Past MI and + CABG  Normal cardiovascular exam Rhythm:Regular Rate:Normal     Neuro/Psych negative neurological ROS  negative psych ROS   GI/Hepatic negative GI ROS, Neg liver ROS,   Endo/Other  negative endocrine ROS  Renal/GU negative Renal ROS  negative genitourinary   Musculoskeletal negative musculoskeletal ROS (+)   Abdominal   Peds negative pediatric ROS (+)  Hematology negative hematology ROS (+)   Anesthesia Other Findings   Reproductive/Obstetrics negative OB ROS                             Anesthesia Physical Anesthesia Plan  ASA: III  Anesthesia Plan: General   Post-op Pain Management:    Induction: Intravenous  Airway Management Planned: LMA  Additional Equipment:   Intra-op Plan:   Post-operative Plan: Extubation in OR  Informed Consent: I have reviewed the patients History and Physical, chart, labs and discussed the procedure including the risks, benefits and alternatives for the proposed anesthesia with the patient or authorized representative who has indicated his/her understanding and acceptance.   Dental advisory given  Plan Discussed with: CRNA and Surgeon  Anesthesia Plan Comments:         Anesthesia Quick Evaluation

## 2017-02-27 NOTE — Anesthesia Preprocedure Evaluation (Addendum)
Anesthesia Evaluation  Patient identified by MRN, date of birth, ID band Patient awake    Reviewed: Allergy & Precautions, NPO status , Patient's Chart, lab work & pertinent test results  Airway Mallampati: II  TM Distance: >3 FB Neck ROM: Full    Dental no notable dental hx.    Pulmonary neg pulmonary ROS, former smoker,    Pulmonary exam normal breath sounds clear to auscultation       Cardiovascular hypertension, + CAD and + Past MI  Normal cardiovascular exam Rhythm:Regular Rate:Normal     Neuro/Psych negative neurological ROS  negative psych ROS   GI/Hepatic negative GI ROS, Neg liver ROS,   Endo/Other  negative endocrine ROS  Renal/GU negative Renal ROS  negative genitourinary   Musculoskeletal negative musculoskeletal ROS (+)   Abdominal   Peds negative pediatric ROS (+)  Hematology negative hematology ROS (+)   Anesthesia Other Findings   Reproductive/Obstetrics negative OB ROS                            Anesthesia Physical Anesthesia Plan  ASA: III  Anesthesia Plan: General   Post-op Pain Management:    Induction: Intravenous  Airway Management Planned: LMA  Additional Equipment:   Intra-op Plan:   Post-operative Plan: Extubation in OR  Informed Consent: I have reviewed the patients History and Physical, chart, labs and discussed the procedure including the risks, benefits and alternatives for the proposed anesthesia with the patient or authorized representative who has indicated his/her understanding and acceptance.   Dental advisory given  Plan Discussed with: CRNA and Surgeon  Anesthesia Plan Comments:         Anesthesia Quick Evaluation

## 2017-02-28 ENCOUNTER — Encounter (HOSPITAL_COMMUNITY): Payer: Self-pay | Admitting: Urology

## 2017-03-06 DIAGNOSIS — R3914 Feeling of incomplete bladder emptying: Secondary | ICD-10-CM | POA: Diagnosis not present

## 2017-03-06 DIAGNOSIS — N138 Other obstructive and reflux uropathy: Secondary | ICD-10-CM | POA: Diagnosis not present

## 2017-03-09 DIAGNOSIS — R3914 Feeling of incomplete bladder emptying: Secondary | ICD-10-CM | POA: Diagnosis not present

## 2017-03-12 NOTE — Addendum Note (Signed)
Addendum  created 03/12/17 1101 by Skylor Hughson, MD   Sign clinical note    

## 2017-03-12 NOTE — Anesthesia Postprocedure Evaluation (Signed)
Anesthesia Post Note  Patient: Dennis Paul  Procedure(s) Performed: Procedure(s) (LRB): CYSTOSCOPY WITH INSERTION OF UROLIFT (N/A)     Anesthesia Post Evaluation  Last Vitals:  Vitals:   02/27/17 0931 02/27/17 1212  BP: 116/71 129/79  Pulse: 60 71  Resp: 14 16  Temp: 36.2 C 36.4 C    Last Pain:  Vitals:   02/27/17 1212  TempSrc: Oral                 Loan Oguin S

## 2017-03-12 NOTE — Addendum Note (Signed)
Addendum  created 03/12/17 1444 by Shebra Muldrow, MD   Sign clinical note    

## 2017-03-23 DIAGNOSIS — H2513 Age-related nuclear cataract, bilateral: Secondary | ICD-10-CM | POA: Diagnosis not present

## 2017-03-23 DIAGNOSIS — H40033 Anatomical narrow angle, bilateral: Secondary | ICD-10-CM | POA: Diagnosis not present

## 2017-06-25 DIAGNOSIS — Z23 Encounter for immunization: Secondary | ICD-10-CM | POA: Diagnosis not present

## 2018-04-14 IMAGING — DX DG CHEST 1V PORT
1 series · 1 of 1 positions shown · non-contrast
Comparison: One-view chest x-ray 11/26/2016

CLINICAL DATA: CABG.

EXAM:
PORTABLE CHEST 1 VIEW

[chest ap]
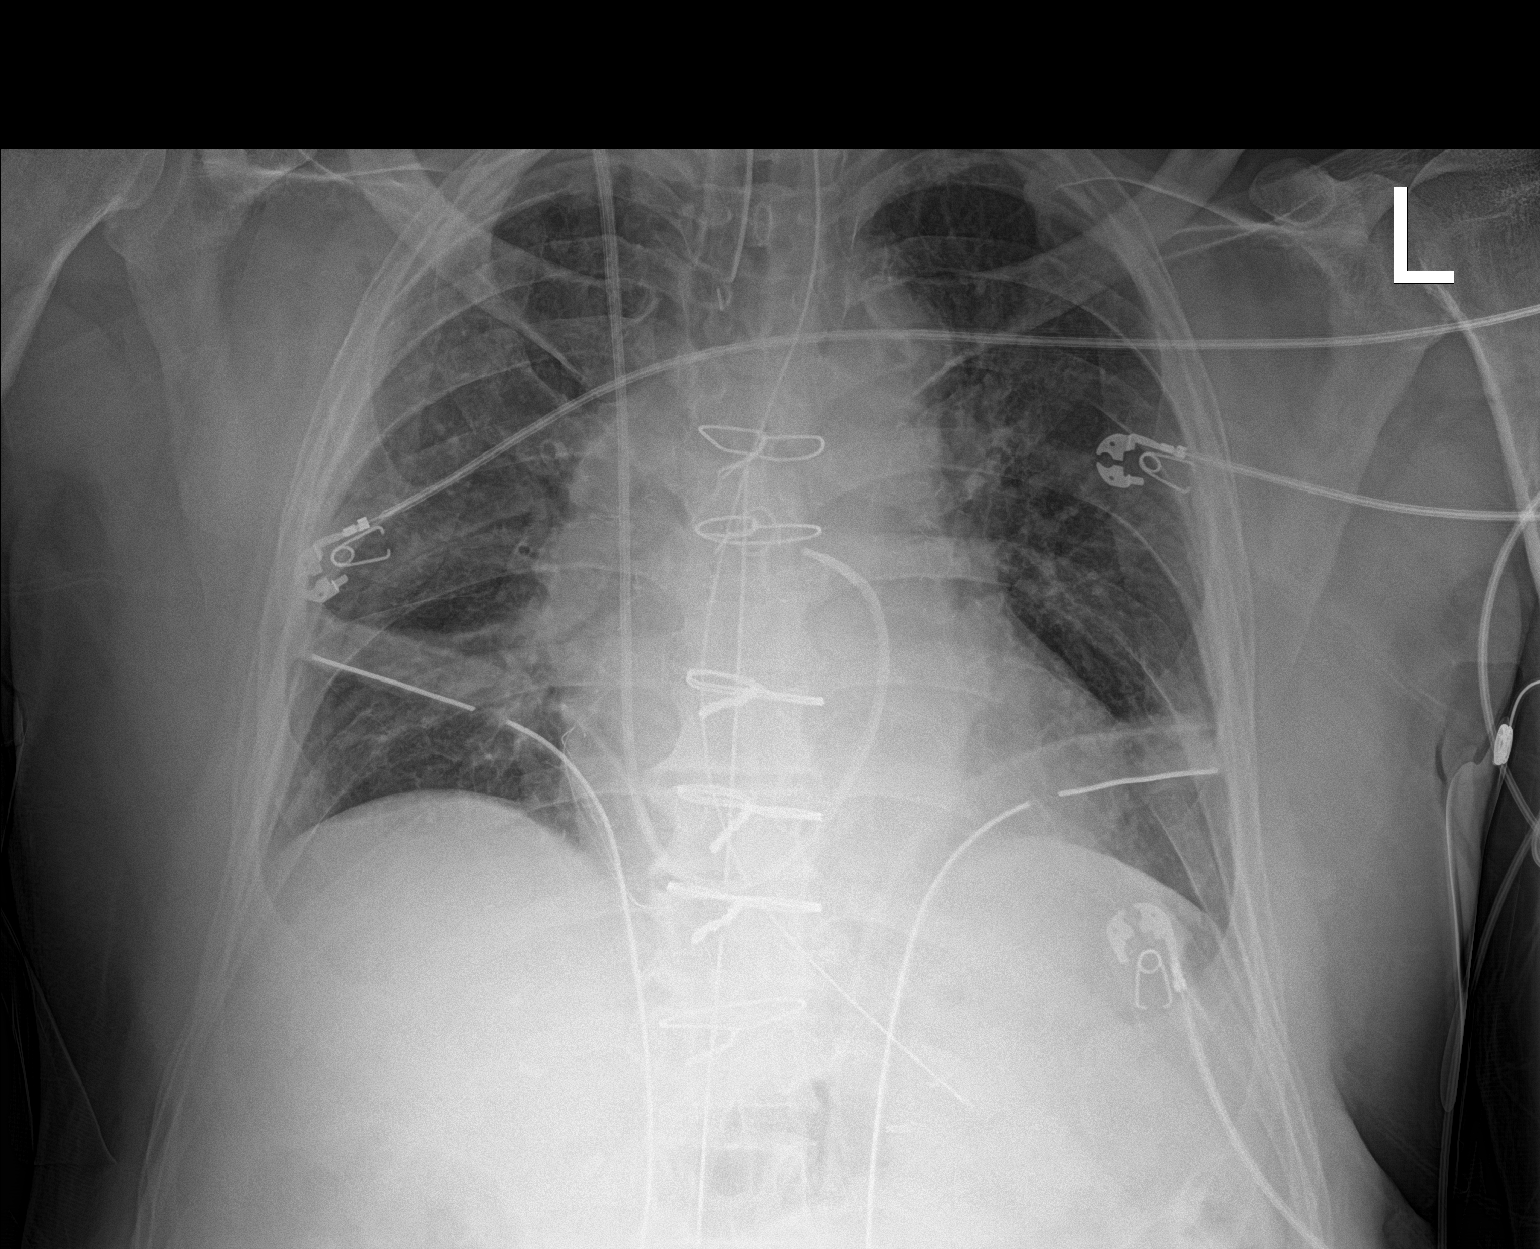

[1 of 1 positions shown; findings below may reference images not displayed]

FINDINGS: Heart is enlarged. The patient is now status post median sternotomy
for CABG. A Swan-Ganz catheter enters via a right IJ sheath
terminates in the main pulmonary artery outflow. Endotracheal tube
is an satisfactory position 5 cm above the carina. The side port of
the NG tube is at the GE junction and could be advanced for more
optimal positioning. Bilateral chest tubes and mediastinal drain are
in place. There is no pneumothorax or pneumomediastinum. The lung
volumes are low without significant focal airspace consolidation.
Six sternal wires are intact.
IMPRESSION: 1. Status post CABG without radiographic evidence for complication.
2. Support apparatus in satisfactory position apart from NG tube
which could be advanced as described.

## 2018-04-16 IMAGING — CR DG CHEST 1V PORT
1 series · 1 of 1 positions shown · non-contrast
Comparison: 11/18/2016.

CLINICAL DATA: Atelectasis.  Coronary artery disease.

EXAM:
PORTABLE CHEST 1 VIEW

[AP]
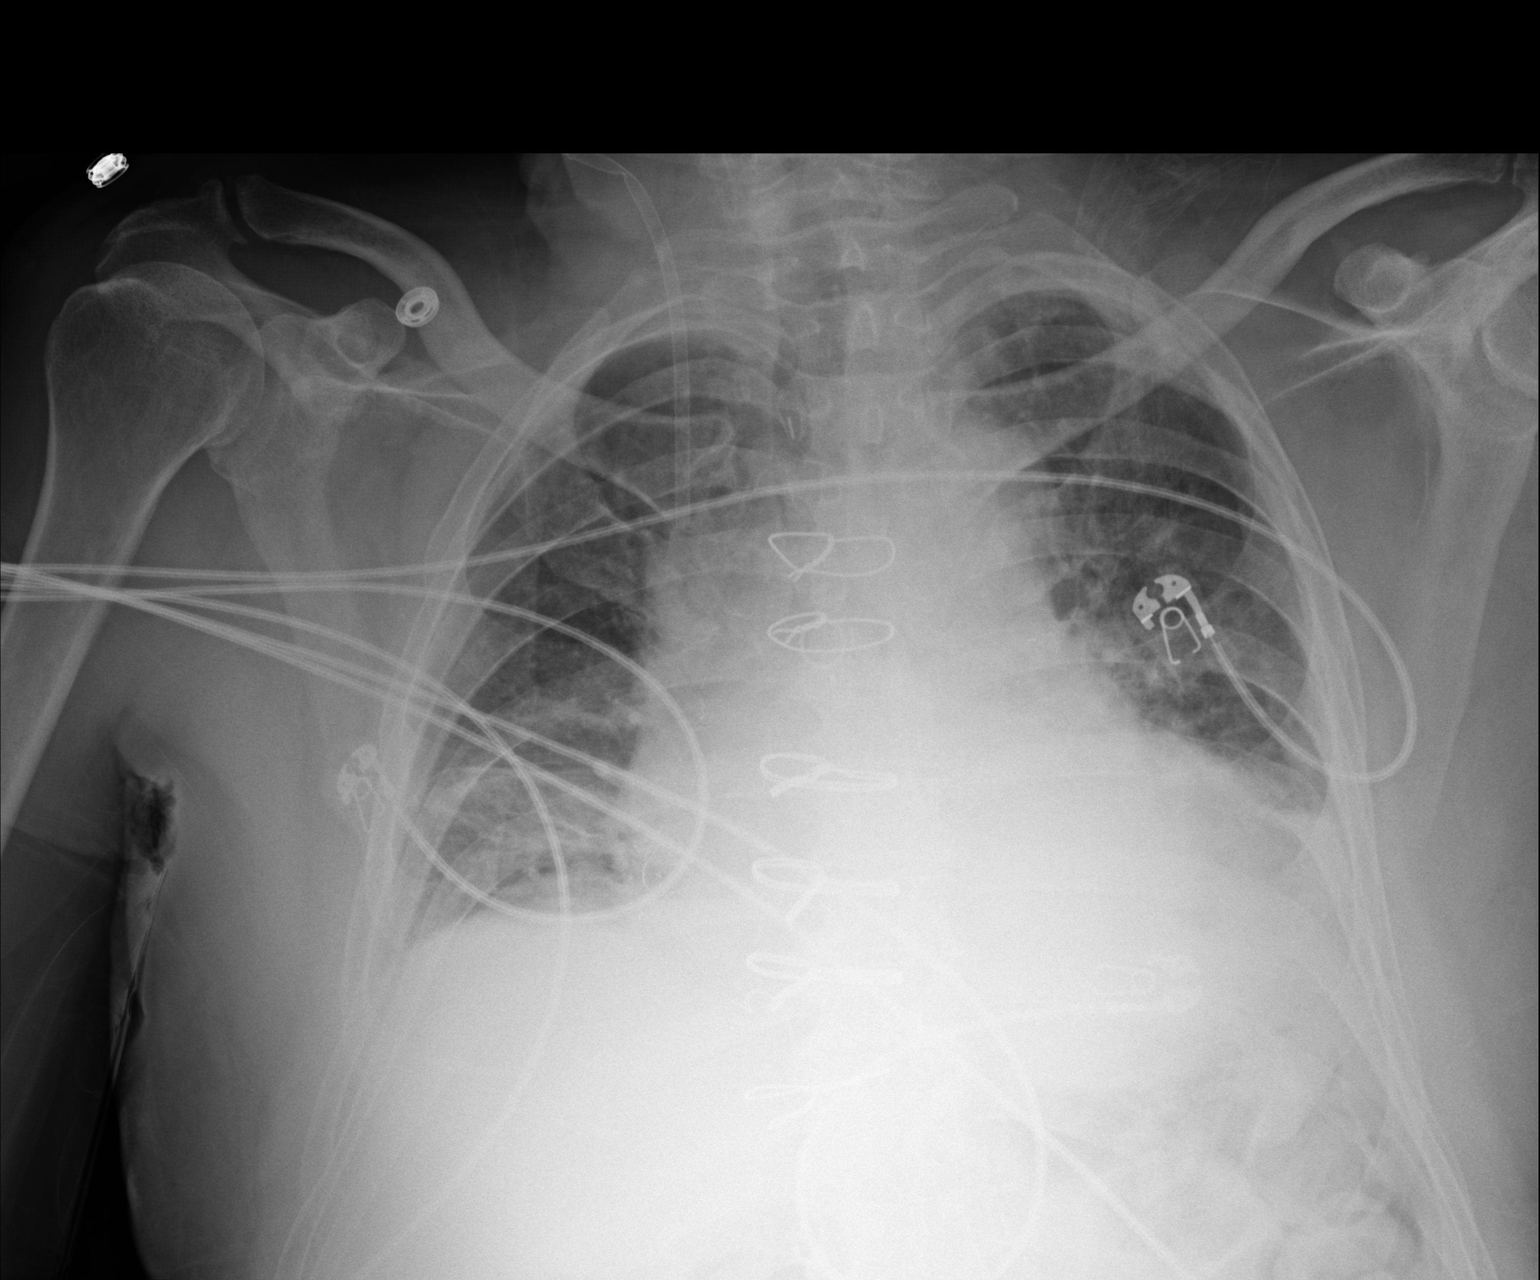

[1 of 1 positions shown; findings below may reference images not displayed]

FINDINGS: Cardiomegaly. Prior CABG. Swan-Ganz catheter has been removed. Low
lung volumes with increasing atelectasis at the bases. Removal of
BILATERAL chest tubes and mediastinal tubes without visible
pneumothorax. Ceejay introducer remains but is kinked in the
neck.
IMPRESSION: Removal of chest tubes and mediastinal tubes with increasing
bibasilar atelectasis, and low lung volumes.

No pneumothorax is evident.  Swan-Ganz catheter has been removed.

Cardiomegaly.

## 2018-04-18 IMAGING — CR DG CHEST 2V
2 series · 2 of 2 positions shown · non-contrast
Comparison: Portable chest x-ray November 19, 2016

CLINICAL DATA: Status post CABG four days ago.

EXAM:
CHEST  2 VIEW

[chest pa]
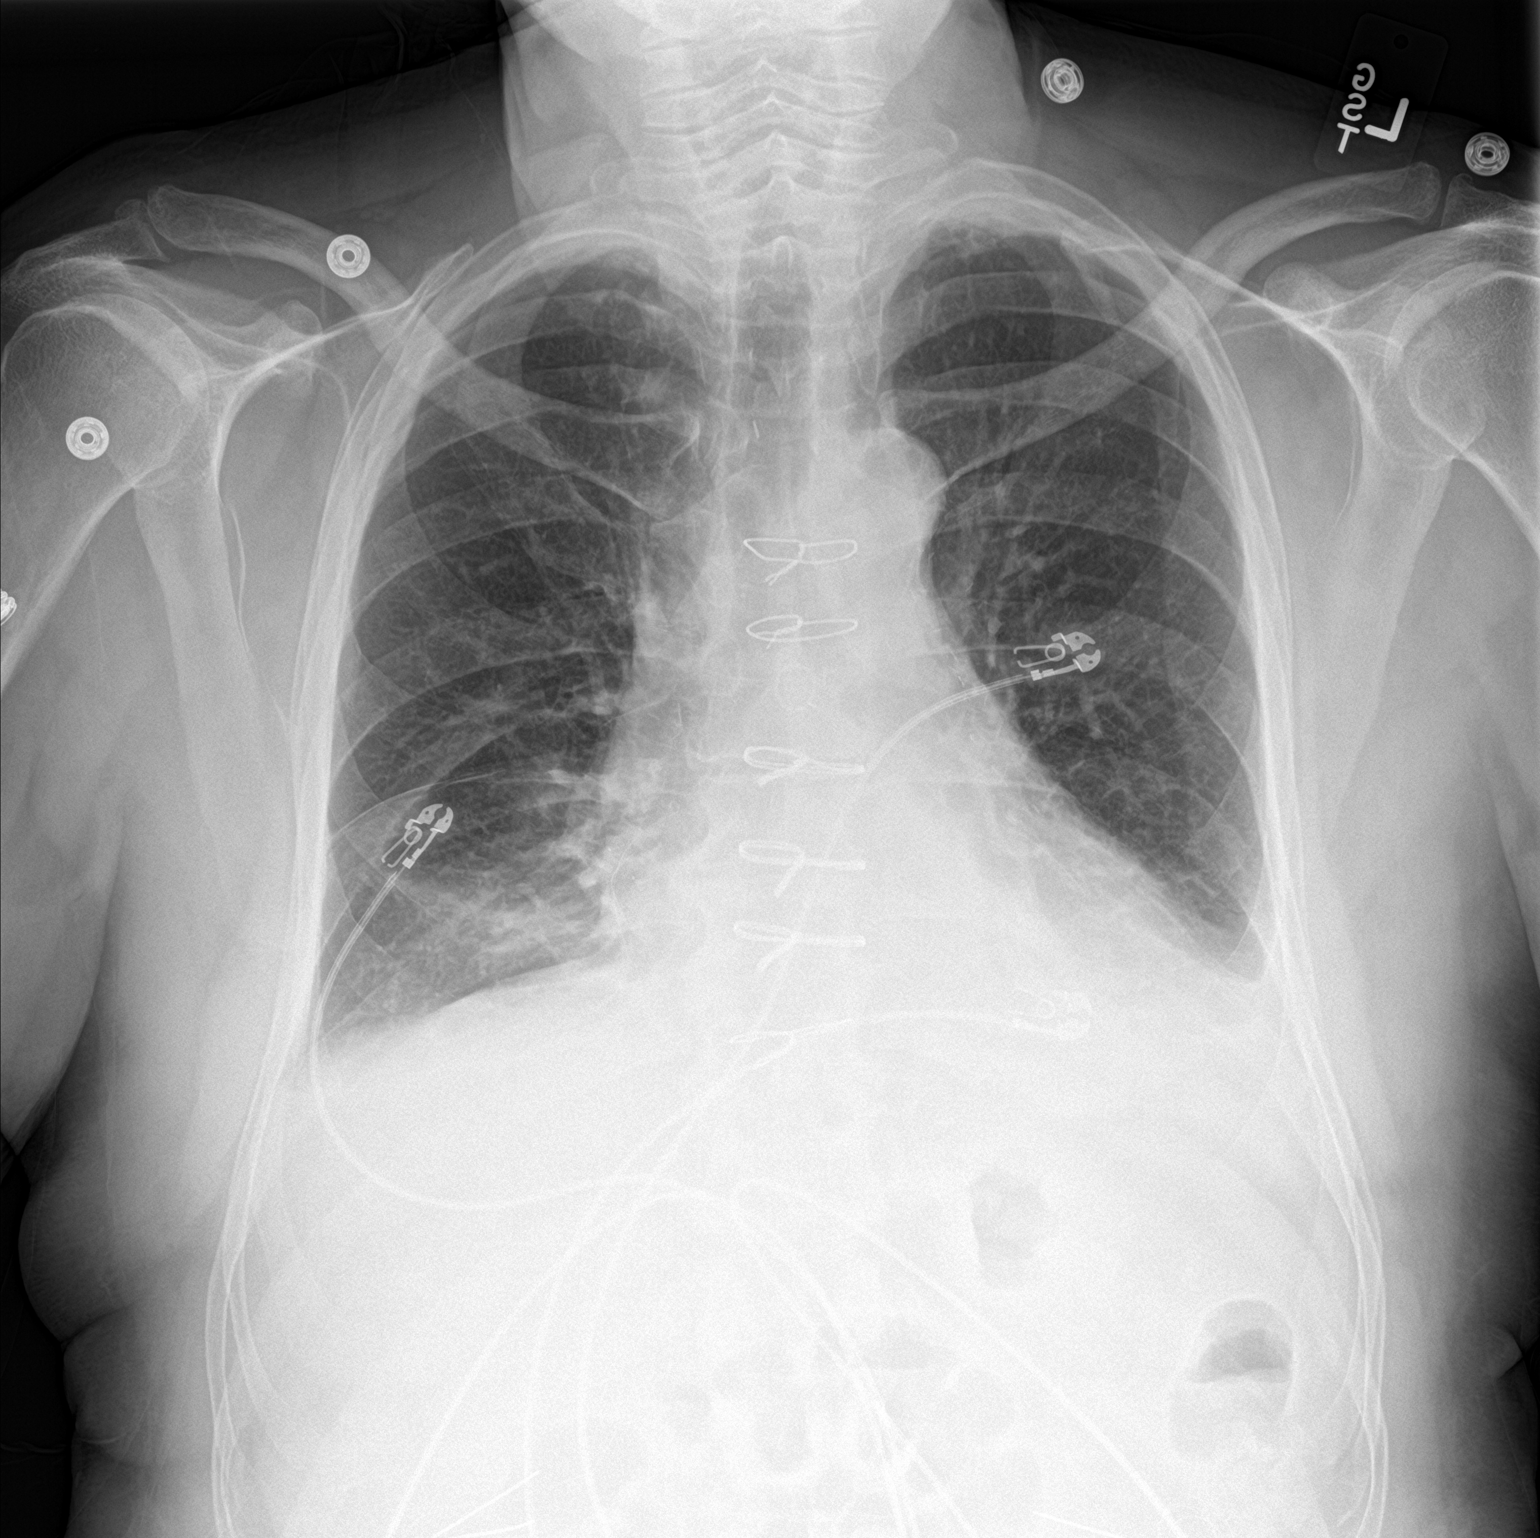

[chest lat]
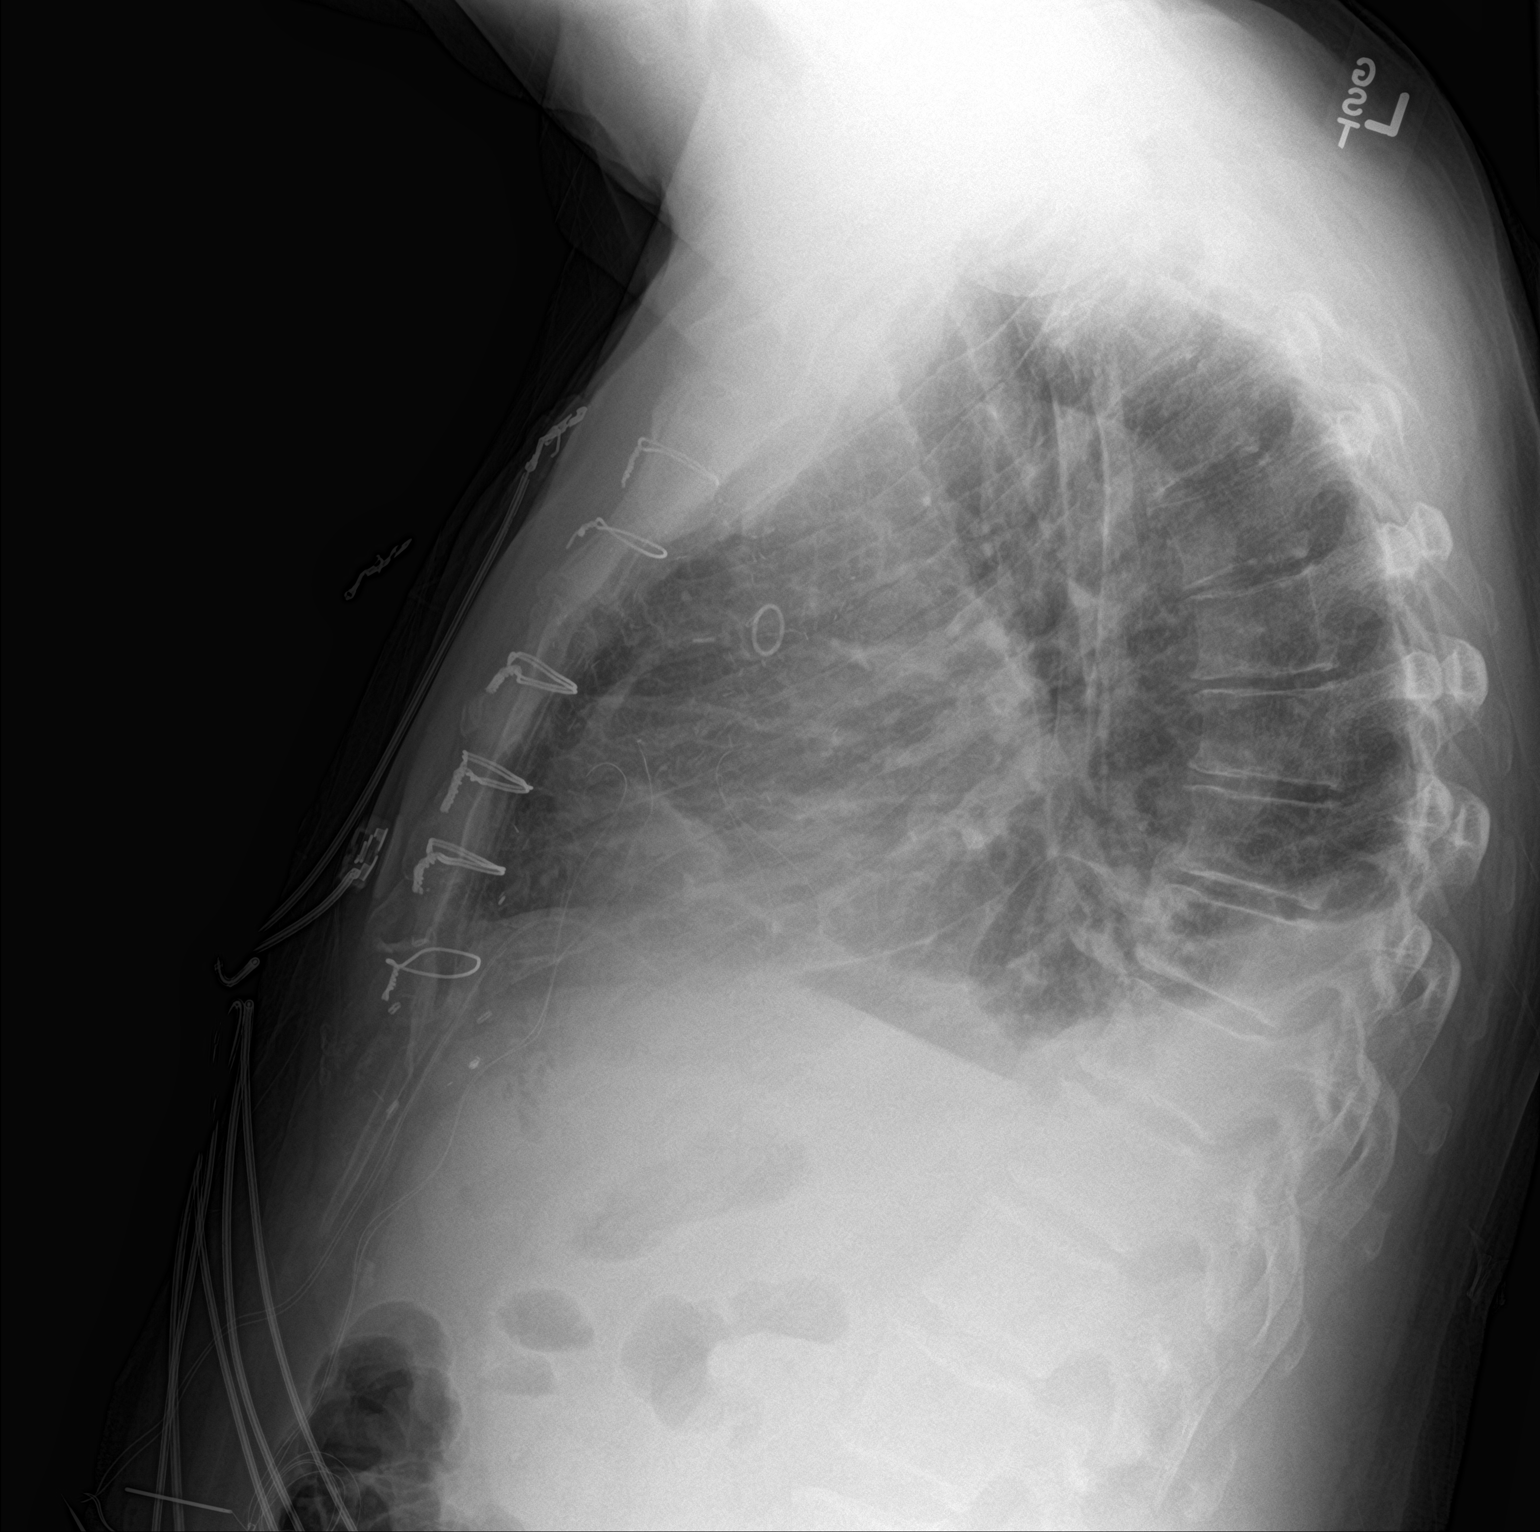

[2 of 2 positions shown; findings below may reference images not displayed]

FINDINGS: The lungs are well-expanded. There is persistent bibasilar
atelectasis which is improving. Small bilateral pleural effusions
remain. The heart is top-normal in size. The pulmonary vascularity
is nearly normal. The mediastinum is normal in width. The sternal
wires are intact. The bony thorax exhibits no acute abnormality.
IMPRESSION: Near total resolution of mild pulmonary edema. Improving bibasilar
atelectasis and small bilateral pleural effusions.

## 2018-05-13 ENCOUNTER — Telehealth: Payer: Self-pay

## 2018-05-13 NOTE — Telephone Encounter (Signed)
SENT REFERRAL TO SCHEDULING AND FILED NOTES 

## 2018-05-23 IMAGING — DX DG CHEST 2V
2 series · 2 of 2 positions shown · non-contrast
Comparison: 11/21/2016

CLINICAL DATA: Status post CABG. Some SOB and anterior chest
discomfort.

EXAM:
CHEST  2 VIEW

[dg chest 2 view (1 of 2)]
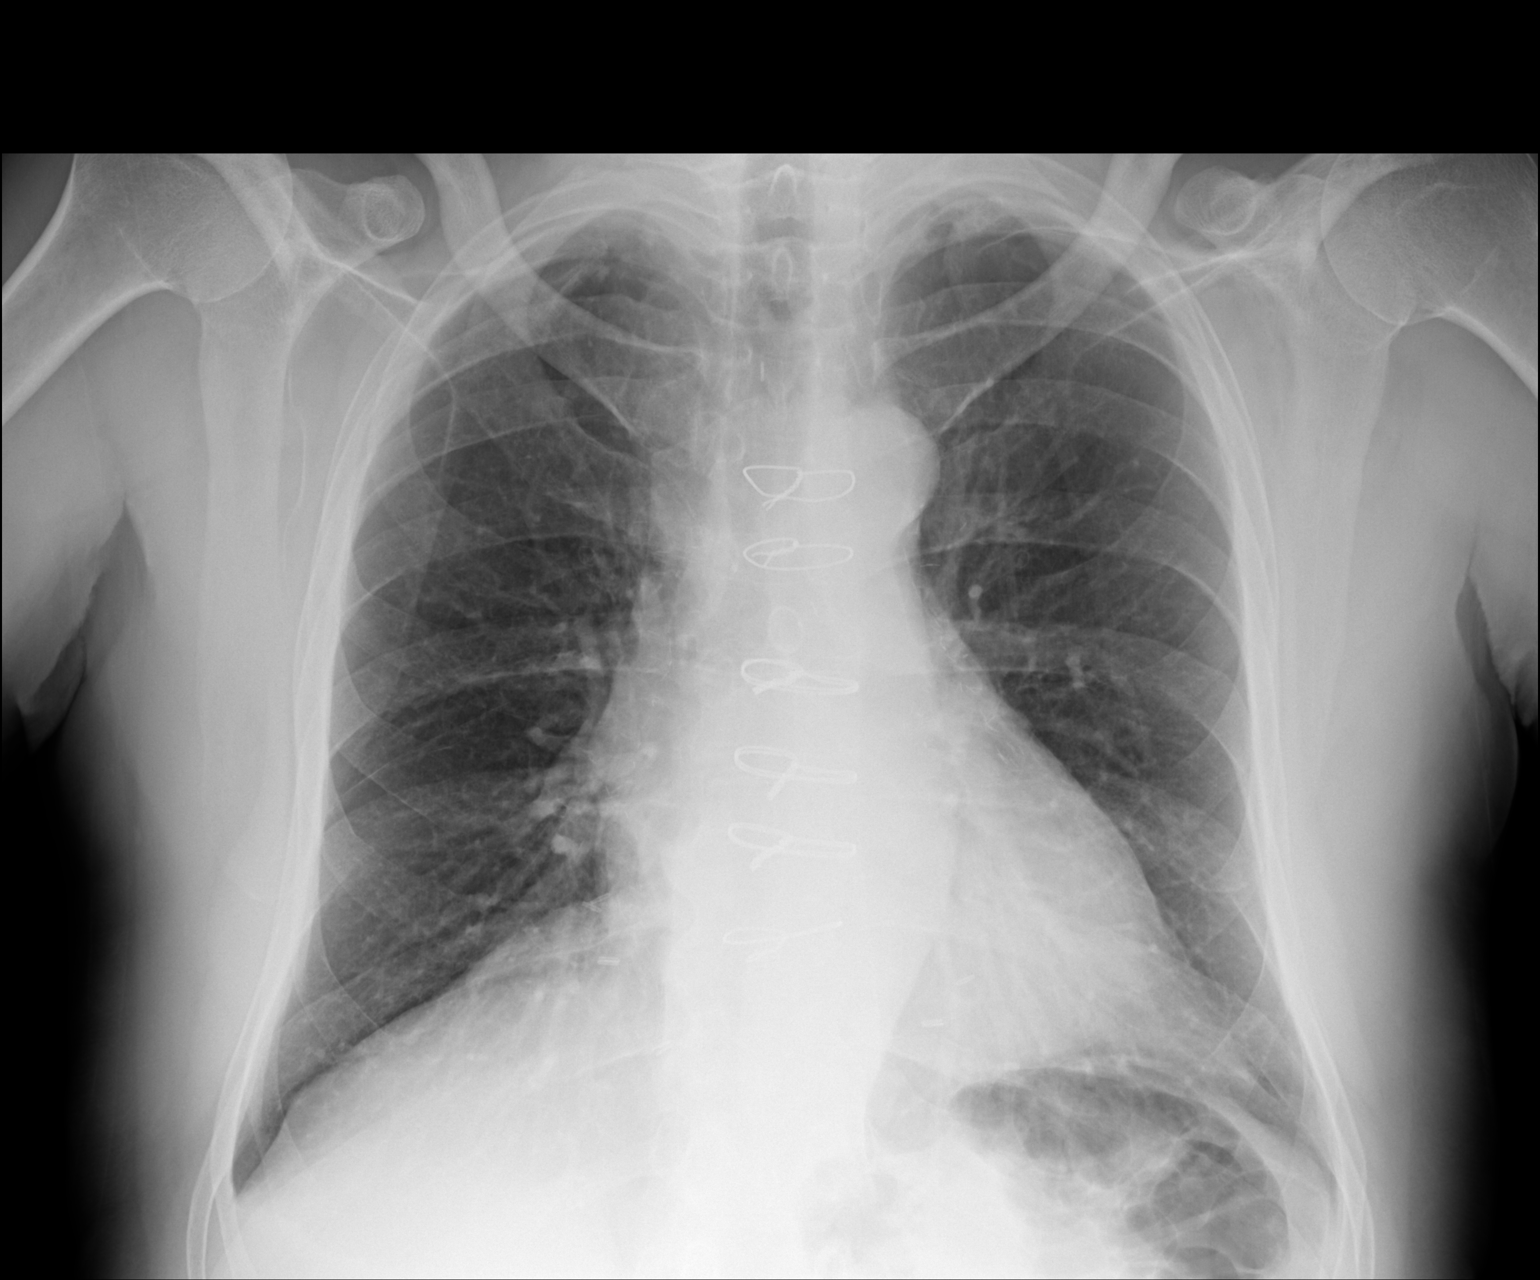

[dg chest 2 view (2 of 2)]
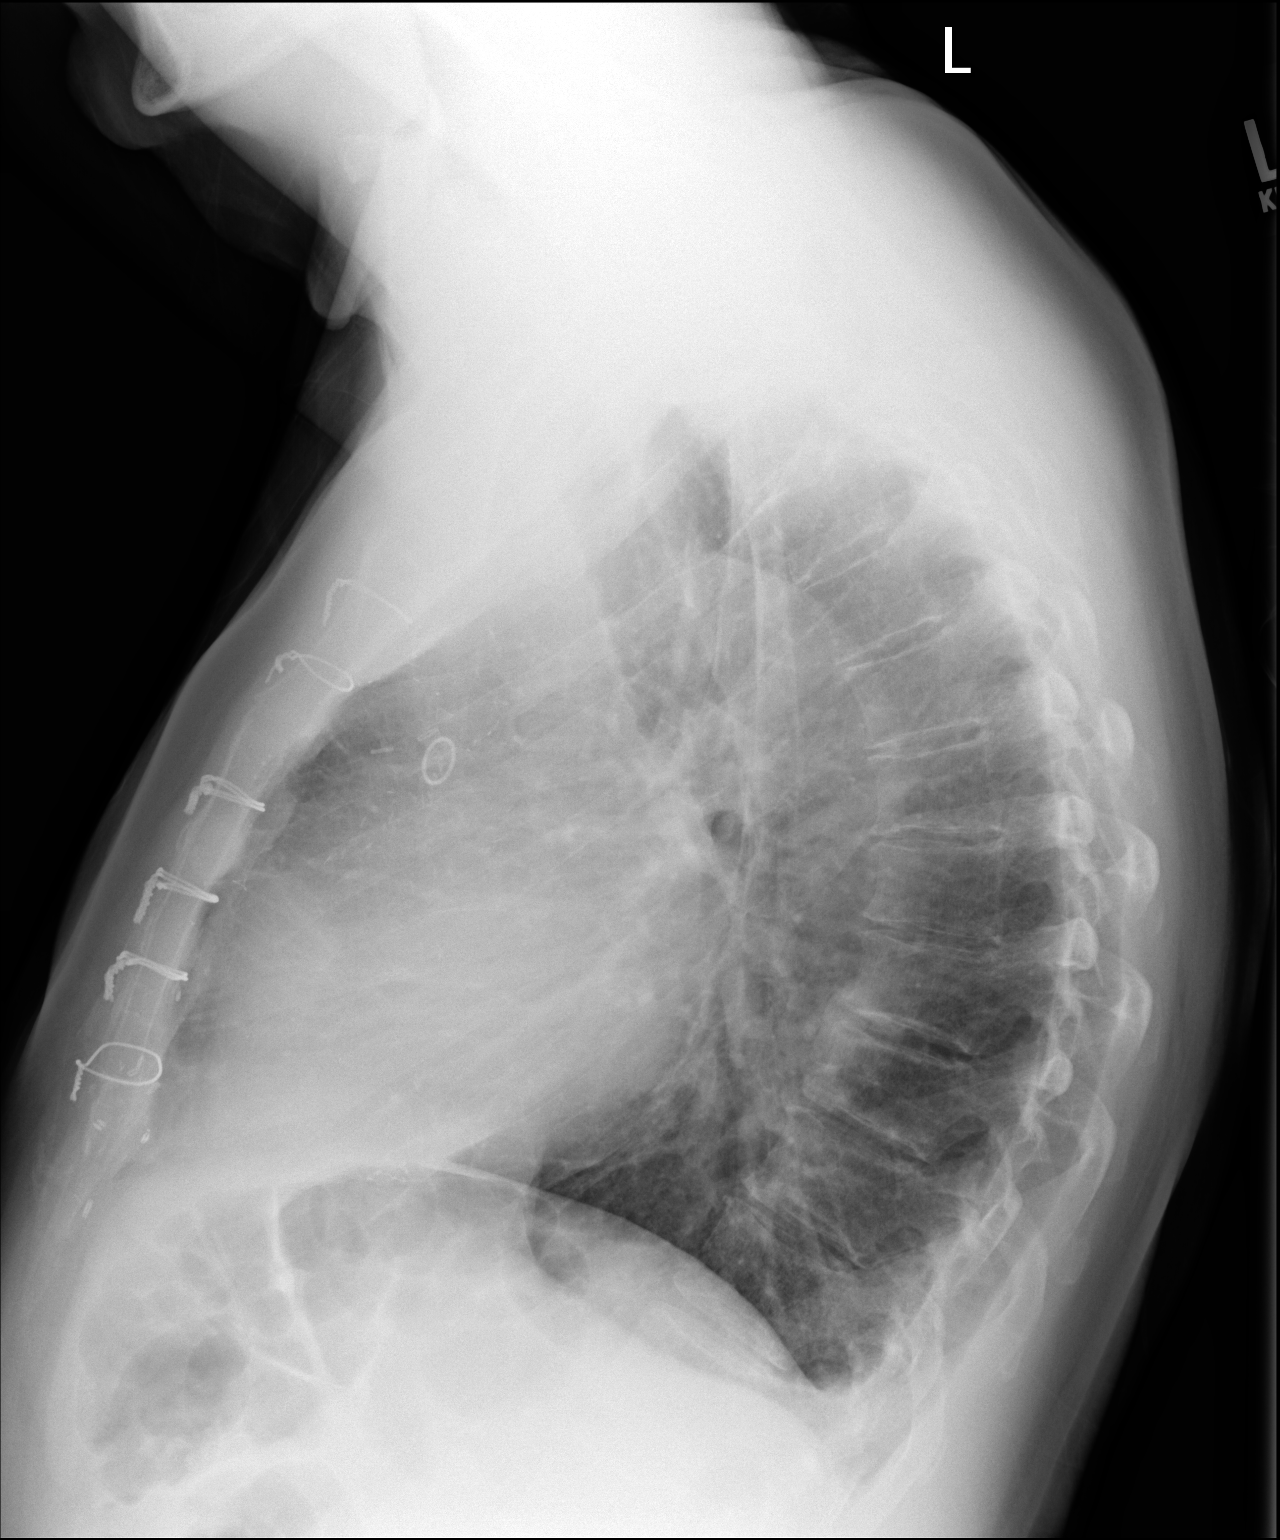

[2 of 2 positions shown; findings below may reference images not displayed]

FINDINGS: Previous median sternotomy and CABG procedure. The heart size
appears normal. No pleural effusion or edema. No airspace opacities.
IMPRESSION: No complications status post CABG procedure.

## 2018-07-02 ENCOUNTER — Ambulatory Visit (INDEPENDENT_AMBULATORY_CARE_PROVIDER_SITE_OTHER): Payer: No Typology Code available for payment source | Admitting: Cardiology

## 2018-07-02 ENCOUNTER — Encounter: Payer: Self-pay | Admitting: Cardiology

## 2018-07-02 VITALS — BP 118/77 | HR 69 | Ht 69.0 in | Wt 199.8 lb

## 2018-07-02 DIAGNOSIS — N183 Chronic kidney disease, stage 3 unspecified: Secondary | ICD-10-CM

## 2018-07-02 DIAGNOSIS — I2510 Atherosclerotic heart disease of native coronary artery without angina pectoris: Secondary | ICD-10-CM

## 2018-07-02 DIAGNOSIS — E782 Mixed hyperlipidemia: Secondary | ICD-10-CM

## 2018-07-02 DIAGNOSIS — Z23 Encounter for immunization: Secondary | ICD-10-CM | POA: Diagnosis not present

## 2018-07-02 NOTE — Patient Instructions (Signed)
Your physician wants you to [facility]-up [facility]: [facility] You will receive a reminder letter in the mail two months in advance. If you don't receive a letter, please call our office to schedule the follow-up appointment.  Your physician recommends that you continue on your current medications as directed. Please refer to the Current Medication list given to you today.  Your physician recommends that you return for lab work - PLEASE FAST 6-8 HOURS PRIOR TO LAB WORK - WE HAVE GIVEN YOU ORDERS    DASH Eating Plan DASH stands for "Dietary Approaches to Stop Hypertension." The DASH eating plan is a healthy eating plan that has been shown to reduce high blood pressure (hypertension). It may also reduce your risk for type 2 diabetes, heart disease, and stroke. The DASH eating plan may also help with weight loss. What are tips for following this plan? General guidelines  Avoid eating more than 2,300 mg (milligrams) of salt (sodium) a day. If you have hypertension, you may need to reduce your sodium intake to 1,500 mg a day.  Limit alcohol intake to no more than 1 drink a day for nonpregnant women and 2 drinks a day for men. One drink equals 12 oz of beer, 5 oz of wine, or 1 oz of hard liquor.  Work with your health care provider to maintain a healthy body weight or to lose weight. Ask what an ideal weight is for you.  Get at least 30 minutes of exercise that causes your heart to beat faster (aerobic exercise) most days of the week. Activities may include walking, swimming, or biking.  Work with your health care provider or diet and nutrition specialist (dietitian) to adjust your eating plan to your individual calorie needs. Reading food labels  Check food labels for the amount of sodium per serving. Choose foods with less than 5 percent of the Daily Value of sodium. Generally, foods with less than 300 mg of sodium per serving fit into this eating plan.  To find whole grains, look for the  word "whole" as the first word in the ingredient list. Shopping  Buy products labeled as "low-sodium" or "no salt added."  Buy fresh foods. Avoid canned foods and premade or frozen meals. Cooking  Avoid adding salt when cooking. Use salt-free seasonings or herbs instead of table salt or sea salt. Check with your health care provider or pharmacist before using salt substitutes.  Do not fry foods. Cook foods using healthy methods such as baking, boiling, grilling, and broiling instead.  Cook with heart-healthy oils, such as olive, canola, soybean, or sunflower oil. Meal planning   Eat a balanced diet that includes: ? 5 or more servings of fruits and vegetables each day. At each meal, try to fill half of your plate with fruits and vegetables. ? Up to 6-8 servings of whole grains each day. ? Less than 6 oz of lean meat, poultry, or fish each day. A 3-oz serving of meat is about the same size as a deck of cards. One egg equals 1 oz. ? 2 servings of low-fat dairy each day. ? A serving of nuts, seeds, or beans 5 times each week. ? Heart-healthy fats. Healthy fats called Omega-3 fatty acids are found in foods such as flaxseeds and coldwater fish, like sardines, salmon, and mackerel.  Limit how much you eat of the following: ? Canned or prepackaged foods. ? Food that is high in trans fat, such as fried foods. ? Food that is  high in saturated fat, such as fatty meat. ? Sweets, desserts, sugary drinks, and other foods with added sugar. ? Full-fat dairy products.  Do not salt foods before eating.  Try to eat at least 2 vegetarian meals each week.  Eat more home-cooked food and less restaurant, buffet, and fast food.  When eating at a restaurant, ask that your food be prepared with less salt or no salt, if possible. What foods are recommended? The items listed may not be a complete list. Talk with your dietitian about what dietary choices are best for you. Grains Whole-grain or  whole-wheat bread. Whole-grain or whole-wheat pasta. Brown rice. [place]. Bulgur. Whole-grain and low-sodium cereals. Pita bread. Low-fat, low-sodium crackers. Whole-wheat flour tortillas. Vegetables Fresh or frozen vegetables (raw, steamed, roasted, or grilled). Low-sodium or reduced-sodium tomato and vegetable juice. Low-sodium or reduced-sodium tomato sauce and tomato paste. Low-sodium or reduced-sodium canned vegetables. Fruits All fresh, dried, or frozen fruit. Canned fruit in natural juice (without added sugar). Meat and other protein foods Skinless chicken or [place]. Ground chicken or [place]. Pork with fat trimmed off. Fish and seafood. Egg whites. Dried beans, peas, or lentils. Unsalted nuts, nut butters, and seeds. Unsalted canned beans. Lean cuts of beef with fat trimmed off. Low-sodium, lean deli meat. Dairy Low-fat (1%) or fat-free (skim) milk. Fat-free, low-fat, or reduced-fat cheeses. Nonfat, low-sodium ricotta or cottage cheese. Low-fat or nonfat yogurt. Low-fat, low-sodium cheese. Fats and oils Soft margarine without trans fats. Vegetable oil. Low-fat, reduced-fat, or light mayonnaise and salad dressings (reduced-sodium). Canola, safflower, olive, soybean, and sunflower oils. Avocado. Seasoning and other foods Herbs. Spices. Seasoning mixes without salt. Unsalted popcorn and pretzels. Fat-free sweets. What foods are not recommended? The items listed may not be a complete list. Talk with your dietitian about what dietary choices are best for you. Grains Baked goods made with fat, such as croissants, muffins, or some breads. Dry pasta or rice meal packs. Vegetables Creamed or fried vegetables. Vegetables in a cheese sauce. Regular canned vegetables (not low-sodium or reduced-sodium). Regular canned tomato sauce and paste (not low-sodium or reduced-sodium). Regular tomato and vegetable juice (not low-sodium or reduced-sodium). Angie Fava. Olives. Fruits Canned fruit in a light  or heavy syrup. Fried fruit. Fruit in cream or butter sauce. Meat and other protein foods Fatty cuts of meat. Ribs. Fried meat. Berniece Salines. Sausage. Bologna and other processed lunch meats. Salami. Fatback. Hotdogs. Bratwurst. Salted nuts and seeds. Canned beans with added salt. Canned or smoked fish. Whole eggs or egg yolks. Chicken or [place] with skin. Dairy Whole or 2% milk, cream, and half-and-half. Whole or full-fat cream cheese. Whole-fat or sweetened yogurt. Full-fat cheese. Nondairy creamers. Whipped toppings. Processed cheese and cheese spreads. Fats and oils Butter. Stick margarine. Lard. Shortening. Ghee. Bacon fat. Tropical oils, such as coconut, palm kernel, or palm oil. Seasoning and other foods Salted popcorn and pretzels. Onion salt, garlic salt, seasoned salt, table salt, and sea salt. Worcestershire sauce. Tartar sauce. Barbecue sauce. Teriyaki sauce. Soy sauce, including reduced-sodium. Steak sauce. Canned and packaged gravies. Fish sauce. Oyster sauce. Cocktail sauce. Horseradish that you find on the shelf. Ketchup. Mustard. Meat flavorings and tenderizers. Bouillon cubes. Hot sauce and Tabasco sauce. Premade or packaged marinades. Premade or packaged taco seasonings. Relishes. Regular salad dressings. Where to find more information:  National Heart, Lung, and [place]: https://wilson-eaton.com/  American Heart Association: www.heart.org Summary  The DASH eating plan is a healthy eating plan that has been shown to reduce high blood pressure (hypertension). It  may also reduce your risk for type 2 diabetes, heart disease, and stroke.  With the DASH eating plan, you should limit salt (sodium) intake to 2,300 mg a day. If you have hypertension, you may need to reduce your sodium intake to 1,500 mg a day.  When on the DASH eating plan, aim to eat more fresh fruits and vegetables, whole grains, lean proteins, low-fat dairy, and heart-healthy fats.  Work with your health care provider or  diet and nutrition specialist (dietitian) to adjust your eating plan to your individual calorie needs. This information is not intended to replace advice given to you by your health care provider. Make sure you discuss any questions you have with your health care provider. Document Released: 09/14/2011 Document Revised: 09/18/2016 Document Reviewed: 09/18/2016 Elsevier Interactive Patient Education  [employer].

## 2018-07-02 NOTE — Progress Notes (Signed)
Clinical Summary [name] is a 67 y.o.male seen today for follow up of the following medical problems.   1. CAD s/ CABG - patient admitted 11/2016 with STEMI.  - cath with severe 3 vessel disease as reported below. Referred for CABG - CABG 11/17/16 with LIMA-LAD, RIMA-PDA, SVG-OM,SVG-D2 - echo 12/2016 LVEF 55-60% -No ACE-I I presume due to soft bp's, renal dysfunction. Sinus bradycardia and lightheadedness on beta blocker.    - some soreness at surgical site at times with position changes - remains active. Can have SOB/DOE that is chronic with high levels of activities. Overall feels good. - Compliant with meds    2. CKD III - has not had recent labs.      Past Medical History:  Diagnosis Date  . Arthritis   . Asthma    had as a child, wheezes now w/ URI  . Coronary artery disease   . Dysrhythmia    afib post op none now   . GERD (gastroesophageal reflux disease)   . History of hiatal hernia   . HTN (hypertension)    no longer takes none since 11/2016 discontinued by cardiologist   . Myocardial infarction (HCC)   . PONV (postoperative nausea and vomiting)      No Known Allergies   Current Outpatient Medications  Medication Sig Dispense Refill  . aspirin EC 81 MG EC tablet Take 1 tablet (81 mg total) by mouth daily.    Marland Kitchen atorvastatin (LIPITOR) 80 MG tablet Take 1 tablet (80 mg total) by mouth daily at 6 PM. 90 tablet 1  . doxycycline (DORYX) 100 MG EC tablet Take 100 mg by mouth 2 (two) times daily.    . finasteride (PROSCAR) 5 MG tablet Take 5 mg by mouth daily.    Marland Kitchen omeprazole (PRILOSEC) 20 MG capsule Take 20 mg by mouth daily.    . tamsulosin (FLOMAX) 0.4 MG CAPS capsule Take 1 capsule (0.4 mg total) by mouth daily. 30 capsule 1   No current facility-administered medications for this visit.      Past Surgical History:  Procedure Laterality Date  . CORONARY ARTERY BYPASS GRAFT N/A 11/17/2016   Procedure: CORONARY ARTERY BYPASS GRAFTING (CABG)x4    LIMA-LAD DVG-OM SVG-PD SVG-DIAG;  Surgeon: Loreli Slot, MD;  Location: [facility];  Service: Open Heart Surgery;  Laterality: N/A;  . CYSTOSCOPY WITH INSERTION OF UROLIFT N/A 02/27/2017   Procedure: CYSTOSCOPY WITH INSERTION OF UROLIFT;  Surgeon: Jerilee Field, MD;  Location: [facility] ORS;  Service: Urology;  Laterality: N/A;  . LEFT HEART CATH AND CORONARY ANGIOGRAPHY N/A 11/16/2016   Procedure: Left Heart Cath and Coronary Angiography;  Surgeon: Peter M Swaziland, MD;  Location: [facility] INVASIVE CV LAB;  Service: Cardiovascular;  Laterality: N/A;  . TEE WITHOUT CARDIOVERSION N/A 11/17/2016   Procedure: TRANSESOPHAGEAL ECHOCARDIOGRAM (TEE);  Surgeon: Loreli Slot, MD;  Location: [facility] OR;  Service: Open Heart Surgery;  Laterality: N/A;     No Known Allergies    Family History  Problem Relation Age of Onset  . Hypertension Mother   . Hypertension Father      Social History Dennis Paul reports that he has quit smoking. He has quit using smokeless tobacco. Dennis Paul reports that he does not drink alcohol.   Review of Systems CONSTITUTIONAL: No weight loss, fever, chills, weakness or fatigue.  HEENT: Eyes: No visual loss, blurred vision, double vision or yellow sclerae.No hearing loss, sneezing, congestion, runny nose or sore throat.  SKIN: No  rash or itching.  CARDIOVASCULAR: per hpi RESPIRATORY: per hpi GASTROINTESTINAL: No anorexia, nausea, vomiting or diarrhea. No abdominal pain or blood.  GENITOURINARY: No burning on urination, no polyuria NEUROLOGICAL: No headache, dizziness, syncope, paralysis, ataxia, numbness or tingling in the extremities. No change in bowel or bladder control.  MUSCULOSKELETAL: No muscle, back pain, joint pain or stiffness.  LYMPHATICS: No enlarged nodes. No history of splenectomy.  PSYCHIATRIC: No history of depression or anxiety.  ENDOCRINOLOGIC: No reports of sweating, cold or heat intolerance. No polyuria or polydipsia.  Marland Kitchen.   Physical Examination Vitals:    07/02/18 1514  BP: 118/77  Pulse: 69   Vitals:   07/02/18 1514  Weight: 199 lb 12.8 oz (90.6 kg)  Height: 5\' 9"  (1.753 m)    Gen: resting comfortably, no acute distress HEENT: no scleral icterus, pupils equal round and reactive, no palptable cervical adenopathy,  CV: RRR, no m/r/g, no jvd Resp: Clear to auscultation bilaterally GI: abdomen is soft, non-tender, non-distended, normal bowel sounds, no hepatosplenomegaly MSK: extremities are warm, no edema.  Skin: warm, no rash Neuro:  no focal deficits Psych: appropriate affect   Diagnostic Studies 11/2016 cath  Mid RCA lesion, 70 %stenosed.  RPDA lesion, 95 %stenosed.  Prox Cx to Mid Cx lesion, 50 %stenosed.  Prox LAD lesion, 90 %stenosed.  Mid LAD lesion, 50 %stenosed.  Ost 2nd Diag lesion, 90 %stenosed.  There is mild to moderate left ventricular systolic dysfunction.  LV end diastolic pressure is normal.  1. Severe 3 vessel obstructive CAD 2. Mild to moderate LV dysfunction 3. 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 The patient is now pain free and ST elevation is improved. He has complex 3 vessel disease with ulcerated plaque in the proximal LAD and bifurcation disease at the take off of a large diagonal Dennis Paul. Given the complexity of disease I think he would be best managed with CABG. Will maximize his medical therapy and consult CT surgery.    12/2016 echo Study Conclusions  - Left ventricle: The cavity size was normal. Wall thickness was normal. Systolic function was normal. The estimated ejection fraction was in the range of 55% to 60%. Left ventricular diastolic function parameters were normal. - Regional wall motion abnormality: Mild hypokinesis of the mid anteroseptal myocardium. - Aortic valve: Mildly calcified annulus. Trileaflet; mildly thickened leaflets. There was mild regurgitation. Valve area (VTI): 3.04 cm^2. Valve area (Vmax): 3.23 cm^2.  Valve area (Vmean): 2.67 cm^2. - Mitral valve: Mildly calcified annulus. Mildly thickened leaflets . There was mild regurgitation. - Technically adequate study.    Assessment and Plan  1. CAD - doing well without significant symptoms - meds limited by renal dysfunction, prior bradycardia and lightheadedness on beta blockers - continue current meds  EKG shows sinus brady at 59, no acute ischemic changes  2,. CKD 3 - repeat labs.   3. Hyperlipidemia - continue statin, repeat labs.    No pcp, we will order annual labs. He gets them at the [facility], we have asked he call his contact there to help arrange labs and get us results.    F/u 6 months    Dennis Paul, M.D.

## 2018-12-26 ENCOUNTER — Telehealth: Payer: Self-pay | Admitting: *Deleted

## 2018-12-26 ENCOUNTER — Telehealth: Payer: Self-pay | Admitting: Cardiology

## 2018-12-26 NOTE — Telephone Encounter (Signed)
Pt contacted per Dr Wyline Mood. History reviewed. No symptoms to suggest any unstable cardiac conditions. Based on discussion, with current pandemic situation, we have postponed 12/27/18 appointment until 02/27/2019. If symptoms change, pt has been instructed to contact our office. Pt wanted to know if the nutrisystem diet is safe - he has gained some weight over the winter and has tried this diet before to help him lose weight. Will forward to provider

## 2018-12-26 NOTE — Telephone Encounter (Signed)
Asking for Dennis Paul to call her in reference to her husband's appointment scheduled for tomorrow

## 2018-12-26 NOTE — Telephone Encounter (Signed)
Pt aware.

## 2018-12-26 NOTE — Telephone Encounter (Signed)
Nutrisystem diet is fine   [name] MD

## 2018-12-26 NOTE — Telephone Encounter (Signed)
See todays phone note - spoke with wife Dois Davenport [facility]) made aware that pt appt was RS to 5/21

## 2018-12-27 ENCOUNTER — Ambulatory Visit: Payer: Medicare Other | Admitting: Cardiology

## 2019-02-26 ENCOUNTER — Telehealth: Payer: Self-pay | Admitting: Cardiology

## 2019-02-26 NOTE — Telephone Encounter (Signed)
Virtual Visit Pre-Appointment Phone Call  "(Name), I am calling you today to discuss your upcoming appointment. We are currently trying to limit exposure to the virus that causes COVID-19 by seeing patients at home rather than in the office."  1. "What is the BEST phone number to call the day of the visit?" - include this in appointment notes  2. Do you have or have access to (through a family member/friend) a smartphone with video capability that we can use for your visit?" a. If yes - list this number in appt notes as cell (if different from BEST phone #) and list the appointment type as a VIDEO visit in appointment notes b. If no - list the appointment type as a PHONE visit in appointment notes  3. Confirm consent - "In the setting of the current Covid19 crisis, you are scheduled for a (phone or video) visit with your provider on (date) at (time).  Just as we do with many in-office visits, in order for you to participate in this visit, we must obtain consent.  If you'd like, I can send this to your mychart (if signed up) or email for you to review.  Otherwise, I can obtain your verbal consent now.  All virtual visits are billed to your insurance company just like a normal visit would be.  By agreeing to a virtual visit, we'd like you to understand that the technology does not allow for your provider to perform an examination, and thus may limit your provider's ability to fully assess your condition. If your provider identifies any concerns that need to be evaluated in person, we will make arrangements to do so.  Finally, though the technology is pretty good, we cannot assure that it will always work on either your or our end, and in the setting of a video visit, we may have to convert it to a phone-only visit.  In either situation, we cannot ensure that we have a secure connection.  Are you willing to proceed?" STAFF: Did the patient verbally acknowledge consent to telehealth visit? Document  YES/NO here: yes  4. Advise patient to be prepared - "Two hours prior to your appointment, go ahead and check your blood pressure, pulse, oxygen saturation, and your weight (if you have the equipment to check those) and write them all down. When your visit starts, your provider will ask you for this information. If you have an Apple Watch or Kardia device, please plan to have heart rate information ready on the day of your appointment. Please have a pen and paper handy nearby the day of the visit as well."  5. Give patient instructions for MyChart download to smartphone OR Doximity/Doxy.me as below if video visit (depending on what platform provider is using)  6. Inform patient they will receive a phone call 15 minutes prior to their appointment time (may be from unknown caller ID) so they should be prepared to answer    TELEPHONE CALL NOTE  Dennis Paul has been deemed a candidate for a follow-up tele-health visit to limit community exposure during the Covid-19 pandemic. I spoke with the patient via phone to ensure availability of phone/video source, confirm preferred email & phone number, and discuss instructions and expectations.  I reminded Dennis Paul to be prepared with any vital sign and/or heart rhythm information that could potentially be obtained via home monitoring, at the time of his visit. I reminded Dennis Paul to expect a phone call prior to  his visit.  Geraldine Contras 02/26/2019 5:08 PM   INSTRUCTIONS FOR DOWNLOADING THE MYCHART APP TO SMARTPHONE  - The patient must first make sure to have activated MyChart and know their login information - If Apple, go to [employer] and type in MyChart in the search bar and download the app. If Android, ask patient to go to [employer] and type in [place] in the search bar and download the app. The app is free but as with any other app downloads, their phone may require them to verify saved payment information or  Apple/Android password.  - The patient will need to then log into the app with their MyChart username and password, and select [facility] as their healthcare provider to link the account. When it is time for your visit, go to the MyChart app, find appointments, and click Begin Video Visit. Be sure to Select Allow for your device to access the Microphone and Camera for your visit. You will then be connected, and your provider will be with you shortly.  **If they have any issues connecting, or need assistance please contact MyChart service desk [phone])**  **If using a computer, in order to ensure the best quality for their visit they will need to use either of the following Internet Browsers: [employer] or Google Chrome**  IF USING DOXIMITY or DOXY.ME - The patient will receive a link just prior to their visit by text.     FULL LENGTH CONSENT FOR TELE-HEALTH VISIT   I hereby voluntarily request, consent and authorize [facility] and its employed or contracted physicians, physician assistants, nurse practitioners or other licensed health care professionals (the Practitioner), to provide me with telemedicine health care services (the Services") as deemed necessary by the treating Practitioner. I acknowledge and consent to receive the Services by the Practitioner via telemedicine. I understand that the telemedicine visit will involve communicating with the Practitioner through live audiovisual communication technology and the disclosure of certain medical information by electronic transmission. I acknowledge that I have been given the opportunity to request an in-person assessment or other available alternative prior to the telemedicine visit and am voluntarily participating in the telemedicine visit.  I understand that I have the right to withhold or withdraw my consent to the use of telemedicine in the course of my care at any time, without affecting my right to future care  or treatment, and that the Practitioner or I may terminate the telemedicine visit at any time. I understand that I have the right to inspect all information obtained and/or recorded in the course of the telemedicine visit and may receive copies of available information for a reasonable fee.  I understand that some of the potential risks of receiving the Services via telemedicine include:   Delay or interruption in medical evaluation due to technological equipment failure or disruption;  Information transmitted may not be sufficient (e.g. poor resolution of images) to allow for appropriate medical decision making by the Practitioner; and/or   In rare instances, security protocols could fail, causing a breach of personal health information.  Furthermore, I acknowledge that it is my responsibility to provide information about my medical history, conditions and care that is complete and accurate to the best of my ability. I acknowledge that Practitioner's advice, recommendations, and/or decision may be based on factors not within their control, such as incomplete or inaccurate data provided by me or distortions of diagnostic images or specimens that may result from electronic transmissions. I  understand that the practice of medicine is not an exact science and that Practitioner makes no warranties or guarantees regarding treatment outcomes. I acknowledge that I will receive a copy of this consent concurrently upon execution via email to the email address I last provided but may also request a printed copy by calling the office of [place].    I understand that my insurance will be billed for this visit.   I have read or had this consent read to me.  I understand the contents of this consent, which adequately explains the benefits and risks of the Services being provided via telemedicine.   I have been provided ample opportunity to ask questions regarding this consent and the Services and have had  my questions answered to my satisfaction.  I give my informed consent for the services to be provided through the use of telemedicine in my medical care  By participating in this telemedicine visit I agree to the above.

## 2019-02-27 ENCOUNTER — Encounter: Payer: Self-pay | Admitting: Cardiology

## 2019-02-27 ENCOUNTER — Telehealth (INDEPENDENT_AMBULATORY_CARE_PROVIDER_SITE_OTHER): Payer: No Typology Code available for payment source | Admitting: Cardiology

## 2019-02-27 VITALS — Ht 69.0 in | Wt 195.0 lb

## 2019-02-27 DIAGNOSIS — I2510 Atherosclerotic heart disease of native coronary artery without angina pectoris: Secondary | ICD-10-CM | POA: Diagnosis not present

## 2019-02-27 DIAGNOSIS — E782 Mixed hyperlipidemia: Secondary | ICD-10-CM

## 2019-02-27 NOTE — Progress Notes (Signed)
Virtual Visit via Telephone Note   This visit type was conducted due to national recommendations for restrictions regarding the COVID-19 Pandemic (e.g. social distancing) in an effort to limit this patient's exposure and mitigate transmission in our community.  Due to his co-morbid illnesses, this patient is at least at moderate risk for complications without adequate follow up.  This format is felt to be most appropriate for this patient at this time.  The patient did not have access to video technology/had technical difficulties with video requiring transitioning to audio format only (telephone).  All issues noted in this document were discussed and addressed.  No physical exam could be performed with this format.  Please refer to the patient's chart for his  consent to telehealth for [facility].   Date:  02/27/2019   ID:  Dennis Paul, Dennis Paul 02-18-1951, MRN [id number]  Patient Location: Home Provider Location: Office  PCP:  Frederich Chick., MD  Cardiologist:  Dina Rich, MD  Electrophysiologist:  None   Evaluation Performed:  Follow-Up Visit  Chief Complaint:  6 month f/u  History of Present Illness:    Dennis Paul is a 68 y.o. male seen today for follow up of the following medical problems.  1. CAD s/p CABG - patient admitted 11/2016 with STEMI.  - cath with severe 3 vessel disease as reported below. Referred for CABG - CABG 11/17/16 with LIMA-LAD, RIMA-PDA, SVG-OM,SVG-D2 -echo 12/2016 LVEF 55-60% -No ACE-I I presume due to soft bp's, renal dysfunction. Sinus bradycardia and lightheadedness on beta blocker.     - no recent chest pain. Chronic SOB mildly increased since last visit but for the most part stable, he attributes it to deconditioning and some weight gain. No LE edema   2. CKD Paul - labs followed at [facility]: retiring from his job tomorrow  The patient does not have symptoms concerning for COVID-19 infection (fever, chills, cough,  or new shortness of breath).    Past Medical History:  Diagnosis Date  . Arthritis   . Asthma    had as a child, wheezes now w/ URI  . Coronary artery disease   . Dysrhythmia    afib post op none now   . GERD (gastroesophageal reflux disease)   . History of hiatal hernia   . HTN (hypertension)    no longer takes none since 11/2016 discontinued by cardiologist   . Myocardial infarction (HCC)   . PONV (postoperative nausea and vomiting)    Past Surgical History:  Procedure Laterality Date  . CORONARY ARTERY BYPASS GRAFT N/A 11/17/2016   Procedure: CORONARY ARTERY BYPASS GRAFTING (CABG)x4   LIMA-LAD DVG-OM SVG-PD SVG-DIAG;  Surgeon: Loreli Slot, MD;  Location: [facility] OR;  Service: Open Heart Surgery;  Laterality: N/A;  . CYSTOSCOPY WITH INSERTION OF UROLIFT N/A 02/27/2017   Procedure: CYSTOSCOPY WITH INSERTION OF UROLIFT;  Surgeon: Jerilee Field, MD;  Location: [facility] ORS;  Service: Urology;  Laterality: N/A;  . LEFT HEART CATH AND CORONARY ANGIOGRAPHY N/A 11/16/2016   Procedure: Left Heart Cath and Coronary Angiography;  Surgeon: Peter M Swaziland, MD;  Location: [facility] INVASIVE CV LAB;  Service: Cardiovascular;  Laterality: N/A;  . TEE WITHOUT CARDIOVERSION N/A 11/17/2016   Procedure: TRANSESOPHAGEAL ECHOCARDIOGRAM (TEE);  Surgeon: Loreli Slot, MD;  Location: [facility] OR;  Service: Open Heart Surgery;  Laterality: N/A;     No outpatient medications have been marked as taking for the 02/27/19 encounter (Appointment) with Antoine Poche,  MD.     Allergies:   Patient has no known allergies.   Social History   Tobacco Use  . Smoking status: Former Games developer  . Smokeless tobacco: Former Engineer, water Use Topics  . Alcohol use: No  . Drug use: No     Family Hx: The patient's family history includes Hypertension in his father and mother.  ROS:   Please see the history of present illness.     All other systems reviewed and are negative.   Prior CV studies:   The following  studies were reviewed today:  11/2016 cath  Mid RCA lesion, 70 %stenosed.  RPDA lesion, 95 %stenosed.  Prox Cx to Mid Cx lesion, 50 %stenosed.  Prox LAD lesion, 90 %stenosed.  Mid LAD lesion, 50 %stenosed.  Ost 2nd Diag lesion, 90 %stenosed.  There is mild to moderate left ventricular systolic dysfunction.  LV end diastolic pressure is normal.  1. Severe 3 vessel obstructive CAD 2. Mild to moderate LV dysfunction 3. 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 The patient is now pain free and ST elevation is improved. He has complex 3 vessel disease with ulcerated plaque in the proximal LAD and bifurcation disease at the take off of a large diagonal Jaken Fregia. Given the complexity of disease I think he would be best managed with CABG. Will maximize his medical therapy and consult CT surgery.    12/2016 echo Study Conclusions  - Left ventricle: The cavity size was normal. Wall thickness was normal. Systolic function was normal. The estimated ejection fraction was in the range of 55% to 60%. Left ventricular diastolic function parameters were normal. - Regional wall motion abnormality: Mild hypokinesis of the mid anteroseptal myocardium. - Aortic valve: Mildly calcified annulus. Trileaflet; mildly thickened leaflets. There was mild regurgitation. Valve area (VTI): 3.04 cm^2. Valve area (Vmax): 3.23 cm^2. Valve area (Vmean): 2.67 cm^2. - Mitral valve: Mildly calcified annulus. Mildly thickened leaflets . There was mild regurgitation. - Technically adequate study.  Labs/Other Tests and Data Reviewed:    EKG:  No ECG reviewed.  Recent Labs: No results found for requested labs within last 8760 hours.   Recent Lipid Panel Lab Results  Component Value Date/Time   CHOL 189 11/17/2016 02:11 AM   TRIG 115 11/17/2016 02:11 AM   HDL 31 (L) 11/17/2016 02:11 AM   CHOLHDL 6.1 11/17/2016 02:11 AM   LDLCALC 135 (H) 11/17/2016 02:11  AM    Wt Readings from Last 3 Encounters:  07/02/18 199 lb 12.8 oz (90.6 kg)  02/27/17 183 lb (83 kg)  02/26/17 183 lb (83 kg)     Objective:    Vital Signs:   Today's Vitals   02/27/19 1452  Weight: 195 lb (88.5 kg)  Height:  (1.753 m)   Body mass index is 28.8 kg/m.  Normal affect. Normal speech pattern and tone. Comfortable, no apparent distress. No audible signs of SOB or DOE.   ASSESSMENT & PLAN:    1. CAD - overall doing well, fairly chronic mild SOB unchanged - continue to monitor at this time, continue current meds   2. Hyperlipidemia - he will continue statin, labs from [place] 09/2018 show LDL is at goal     COVID-19 Education: The signs and symptoms of COVID-19 were discussed with the patient and how to seek care for testing (follow up with PCP or arrange E-visit).  The importance of social distancing was discussed today.  Time:  Today, I have spent 15 minutes with the patient with telehealth technology discussing the above problems.     Medication Adjustments/Labs and Tests Ordered: Current medicines are reviewed at length with the patient today.  Concerns regarding medicines are outlined above.   Tests Ordered: No orders of the defined types were placed in this encounter.   Medication Changes: No orders of the defined types were placed in this encounter.   Disposition:  Follow up 4 months  Signed, Dina RichBranch, Aiman Sonn, MD  02/27/2019 2:10 PM    [facility]

## 2019-02-27 NOTE — Patient Instructions (Signed)
Your physician recommends that you schedule a follow-up appointment in: 4 MONTHS WITH DR BRANCH  Your physician recommends that you continue on your current medications as directed. Please refer to the Current Medication list given to you today.  Thank you for choosing [facility]!!    

## 2019-06-30 ENCOUNTER — Other Ambulatory Visit: Payer: Self-pay

## 2019-06-30 ENCOUNTER — Encounter: Payer: Self-pay | Admitting: Cardiology

## 2019-06-30 ENCOUNTER — Ambulatory Visit (INDEPENDENT_AMBULATORY_CARE_PROVIDER_SITE_OTHER): Payer: No Typology Code available for payment source | Admitting: Cardiology

## 2019-06-30 VITALS — BP 119/72 | HR 68 | Temp 98.7°F | Ht 69.0 in | Wt 211.2 lb

## 2019-06-30 DIAGNOSIS — Z23 Encounter for immunization: Secondary | ICD-10-CM | POA: Diagnosis not present

## 2019-06-30 DIAGNOSIS — I2510 Atherosclerotic heart disease of native coronary artery without angina pectoris: Secondary | ICD-10-CM

## 2019-06-30 DIAGNOSIS — E782 Mixed hyperlipidemia: Secondary | ICD-10-CM

## 2019-06-30 NOTE — Progress Notes (Signed)
Clinical Summary [name] is a 68 y.o.male  seen today for follow up of the following medical problems.  1. CAD s/p CABG - patient admitted 11/2016 with STEMI.  - cath with severe 3 vessel disease as reported below. Referred for CABG - CABG 11/17/16 with LIMA-LAD, RIMA-PDA, SVG-OM,SVG-D2 -echo 12/2016 LVEF 55-60% -No ACE-I I presume due to soft bp's, renal dysfunction.Last labs also showed high K. Sinus bradycardia and lightheadedness on beta blocker.    - no recent chest pain. Can have some left arm pain. Dull squeezing feeling, startes in wrist into shoulder. Not positional, can last 2-3 hours. No other associated symptoms.  - compliatn with meds   2. CKD III - labs followed at [facility]  3. Hyperlipidemia - 09/2018 TC 143 TG 95 LDL 69 HDL 55 - compliant with statin.     Past Medical History:  Diagnosis Date  . Arthritis   . Asthma    had as a child, wheezes now w/ URI  . Coronary artery disease   . Dysrhythmia    afib post op none now   . GERD (gastroesophageal reflux disease)   . History of hiatal hernia   . HTN (hypertension)    no longer takes none since 11/2016 discontinued by cardiologist   . Myocardial infarction ([place])   . PONV (postoperative nausea and vomiting)      No Known Allergies   Current Outpatient Medications  Medication Sig Dispense Refill  . aspirin EC 81 MG EC tablet Take 1 tablet (81 mg total) by mouth daily.    Dennis Paul atorvastatin (LIPITOR) 80 MG tablet Take 1 tablet (80 mg total) by mouth daily at 6 PM. 90 tablet 1  . omeprazole (PRILOSEC) 20 MG capsule Take 20 mg by mouth daily.    . tamsulosin (FLOMAX) 0.4 MG CAPS capsule Take 1 capsule (0.4 mg total) by mouth daily. 30 capsule 1   No current facility-administered medications for this visit.      Past Surgical History:  Procedure Laterality Date  . CORONARY ARTERY BYPASS GRAFT N/A 11/17/2016   Procedure: CORONARY ARTERY BYPASS GRAFTING (CABG)x4   LIMA-LAD DVG-OM SVG-PD SVG-DIAG;   Surgeon: Melrose Nakayama, MD;  Location: [place];  Service: Open Heart Surgery;  Laterality: N/A;  . CYSTOSCOPY WITH INSERTION OF UROLIFT N/A 02/27/2017   Procedure: CYSTOSCOPY WITH INSERTION OF UROLIFT;  Surgeon: Festus Aloe, MD;  Location[facility];  Service: Urology;  Laterality: N/A;  . LEFT HEART CATH AND CORONARY ANGIOGRAPHY N/A 11/16/2016   Procedure: Left Heart Cath and Coronary Angiography;  Surgeon: Peter M Martinique, MD;  Location: [place] [facility];  Service: Cardiovascular;  Laterality: N/A;  . TEE WITHOUT CARDIOVERSION N/A 11/17/2016   Procedure: TRANSESOPHAGEAL ECHOCARDIOGRAM (TEE);  Surgeon: Melrose Nakayama, MD;  Location: [place];  Service: Open Heart Surgery;  Laterality: N/A;     No Known Allergies    Family History  Problem Relation Age of Onset  . Hypertension Mother   . Hypertension Father      Social History Dennis Paul reports that he has quit smoking. He has quit using smokeless tobacco. Mr. Geibel reports no history of alcohol use.   Review of Systems CONSTITUTIONAL: No weight loss, fever, chills, weakness or fatigue.  HEENT: Eyes: No visual loss, blurred vision, double vision or yellow sclerae.No hearing loss, sneezing, congestion, runny nose or sore throat.  SKIN: No rash or itching.  CARDIOVASCULAR: per hpi RESPIRATORY: No shortness of breath, cough or sputum.  GASTROINTESTINAL: No anorexia, nausea, vomiting or diarrhea. No abdominal pain or blood.  GENITOURINARY: No burning on urination, no polyuria NEUROLOGICAL: No headache, dizziness, syncope, paralysis, ataxia, numbness or tingling in the extremities. No change in bowel or bladder control.  MUSCULOSKELETAL: No muscle, back pain, joint pain or stiffness.  LYMPHATICS: No enlarged nodes. No history of splenectomy.  PSYCHIATRIC: No history of depression or anxiety.  ENDOCRINOLOGIC: No reports of sweating, cold or heat intolerance. No polyuria or polydipsia.  Dennis Paul   Physical Examination Today's  Vitals   06/30/19 1313  BP: 119/72  Pulse: 68  Temp: 98.7 F (37.1 C)  SpO2: 98%  Weight: 211 lb 3.2 oz (95.8 kg)  Height: 5\' 9"  (1.753 m)   Body mass index is 31.19 kg/m.  Gen: resting comfortably, no acute distress HEENT: no scleral icterus, pupils equal round and reactive, no palptable cervical adenopathy,  CV: RRR, no m/r/g, no jvd Resp: Clear to auscultation bilaterally GI: abdomen is soft, non-tender, non-distended, normal bowel sounds, no hepatosplenomegaly MSK: extremities are warm, no edema.  Skin: warm, no rash Neuro:  no focal deficits Psych: appropriate affect   Diagnostic Studies 11/2016 cath  Mid RCA lesion, 70 %stenosed.  RPDA lesion, 95 %stenosed.  Prox Cx to Mid Cx lesion, 50 %stenosed.  Prox LAD lesion, 90 %stenosed.  Mid LAD lesion, 50 %stenosed.  Ost 2nd Diag lesion, 90 %stenosed.  There is mild to moderate left ventricular systolic dysfunction.  LV end diastolic pressure is normal.  1. Severe 3 vessel obstructive CAD 2. Mild to moderate LV dysfunction 3. 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 The patient is now pain free and ST elevation is improved. He has complex 3 vessel disease with ulcerated plaque in the proximal LAD and bifurcation disease at the take off of a large diagonal Dennis Paul the complexity of disease I think he would be best managed with CABG. Will maximize his medical therapy and consult CT surgery.    12/2016 echo Study Conclusions  - Left ventricle: The cavity size was normal. Wall thickness was normal. Systolic function was normal. The estimated ejection fraction was in the range of 55% to 60%. Left ventricular diastolic function parameters were normal. - Regional wall motion abnormality: Mild hypokinesis of the mid anteroseptal myocardium. - Aortic valve: Mildly calcified annulus. Trileaflet; mildly thickened leaflets. There was mild regurgitation. Valve area  (VTI): 3.04 cm^2. Valve area (Vmax): 3.23 cm^2. Valve area (Vmean): 2.67 cm^2. - Mitral valve: Mildly calcified annulus. Mildly thickened leaflets . There was mild regurgitation. - Technically adequate study.    Assessment and Plan   1. CAD -no symptoms, continue current meds. Limitations on medical therapy as listed above, not on ACEI/ARB or beta blocker - EKG today SR, no acute ischemic changes   2. Hyperlipidemia - at goal, continue statin - labs followed at [facility]   F/u 6 months  [facility], M.D.,

## 2019-06-30 NOTE — Patient Instructions (Addendum)

## 2019-12-15 ENCOUNTER — Telehealth: Payer: Medicare PPO | Admitting: Cardiology

## 2019-12-17 ENCOUNTER — Telehealth: Payer: Medicare PPO | Admitting: Cardiology

## 2020-01-08 ENCOUNTER — Ambulatory Visit: Payer: Medicare PPO | Admitting: Cardiology

## 2020-02-11 DIAGNOSIS — D649 Anemia, unspecified: Secondary | ICD-10-CM | POA: Diagnosis not present

## 2020-02-11 DIAGNOSIS — I2510 Atherosclerotic heart disease of native coronary artery without angina pectoris: Secondary | ICD-10-CM | POA: Diagnosis not present

## 2020-02-11 DIAGNOSIS — Z6829 Body mass index (BMI) 29.0-29.9, adult: Secondary | ICD-10-CM | POA: Diagnosis not present

## 2020-02-11 DIAGNOSIS — Z0000 Encounter for general adult medical examination without abnormal findings: Secondary | ICD-10-CM | POA: Diagnosis not present

## 2020-02-11 DIAGNOSIS — R5383 Other fatigue: Secondary | ICD-10-CM | POA: Diagnosis not present

## 2020-02-11 DIAGNOSIS — I10 Essential (primary) hypertension: Secondary | ICD-10-CM | POA: Diagnosis not present

## 2020-02-13 DIAGNOSIS — D649 Anemia, unspecified: Secondary | ICD-10-CM | POA: Diagnosis not present

## 2020-02-13 DIAGNOSIS — I2510 Atherosclerotic heart disease of native coronary artery without angina pectoris: Secondary | ICD-10-CM | POA: Diagnosis not present

## 2020-02-13 DIAGNOSIS — R5383 Other fatigue: Secondary | ICD-10-CM | POA: Diagnosis not present

## 2020-02-13 DIAGNOSIS — Z79899 Other long term (current) drug therapy: Secondary | ICD-10-CM | POA: Diagnosis not present

## 2020-02-13 DIAGNOSIS — I10 Essential (primary) hypertension: Secondary | ICD-10-CM | POA: Diagnosis not present

## 2020-02-13 DIAGNOSIS — D508 Other iron deficiency anemias: Secondary | ICD-10-CM | POA: Diagnosis not present

## 2020-02-13 DIAGNOSIS — Z0000 Encounter for general adult medical examination without abnormal findings: Secondary | ICD-10-CM | POA: Diagnosis not present

## 2020-02-23 DIAGNOSIS — D649 Anemia, unspecified: Secondary | ICD-10-CM | POA: Diagnosis not present

## 2020-06-10 NOTE — Progress Notes (Signed)
Cardiology Office Note  Date: 06/11/2020   ID: Dennis Paul, DOB 03-18-1951, MRN [id number]  PCP:  Toma Deiters, MD  Cardiologist:  Dina Rich, MD Electrophysiologist:  None   Chief Complaint: Follow-up CAD  History of Present Illness: Dennis Paul is a 69 y.o. male with a history of CAD status post CABG 2019 (LIMA-LAD, RIMA-PDA, SVG-OM, SVG-D2).  Admitted with STEMI 2018, cath severe three-vessel disease, referred for CABG.  Status post four-vessel CABG as listed above.  CKD stage Paul follows at [facility], hyperlipidemia compliant with statin.   Last saw Dr. Wyline Mood 06/30/2019.  He had no recent chest pain.  Occasional left arm pain, dull squeezing feeling, starting in wrist to shoulder.  Not positional and could last from 2 to 3 hours.  No other associated symptoms.  [facility]  He was compliant with the statin medications.  Past Medical History:  Diagnosis Date  . Arthritis   . Asthma    had as a child, wheezes now w/ URI  . Coronary artery disease   . Dysrhythmia    afib post op none now   . GERD (gastroesophageal reflux disease)   . History of hiatal hernia   . HTN (hypertension)    no longer takes none since 11/2016 discontinued by cardiologist   . Myocardial infarction ([facility])   . PONV (postoperative nausea and vomiting)     Past Surgical History:  Procedure Laterality Date  . CORONARY ARTERY BYPASS GRAFT N/A 11/17/2016   Procedure: CORONARY ARTERY BYPASS GRAFTING (CABG)x4   LIMA-LAD DVG-OM SVG-PD SVG-DIAG;  Surgeon: Loreli Slot, MD;  [facility];  Service: Open Heart Surgery;  Laterality: N/A;  . CYSTOSCOPY WITH INSERTION OF UROLIFT N/A 02/27/2017   Procedure: CYSTOSCOPY WITH INSERTION OF UROLIFT;  Surgeon: Jerilee Field, MD;  [facility];  Service: Urology;  Laterality: N/A;  . LEFT HEART CATH AND CORONARY ANGIOGRAPHY N/A 11/16/2016   Procedure: Left Heart Cath and Coronary Angiography;  Surgeon: Peter M Swaziland,  MD;  Location: [facility] INVASIVE CV LAB;  Service: Cardiovascular;  Laterality: N/A;  . TEE WITHOUT CARDIOVERSION N/A 11/17/2016   Procedure: TRANSESOPHAGEAL ECHOCARDIOGRAM (TEE);  Surgeon: Loreli Slot, MD;  Location: [facility];  Service: Open Heart Surgery;  Laterality: N/A;    Current Outpatient Medications  Medication Sig Dispense Refill  . aspirin EC 81 MG EC tablet Take 1 tablet (81 mg total) by mouth daily.    Marland Kitchen atorvastatin (LIPITOR) 80 MG tablet Take 1 tablet (80 mg total) by mouth daily at 6 PM. 90 tablet 1  . omeprazole (PRILOSEC) 20 MG capsule Take 20 mg by mouth 2 (two) times daily before a meal.     . tamsulosin (FLOMAX) 0.4 MG CAPS capsule Take 1 capsule (0.4 mg total) by mouth daily. 30 capsule 1   No current facility-administered medications for this visit.   Allergies:  Patient has no known allergies.   Social History: The patient  reports that he has quit smoking. He has quit using smokeless tobacco. He reports that he does not drink alcohol and does not use drugs.   Family History: The patient's family history includes Hypertension in his father and mother.   ROS:  Please see the history of present illness. Otherwise, complete review of systems is positive for none.  All other systems are reviewed and negative.   Physical Exam: VS:  BP 122/80   Pulse 76   Ht 5\' 9"  (  1.753 m)   Wt 200 lb (90.7 kg)   SpO2 98%   BMI 29.53 kg/m , BMI Body mass index is 29.53 kg/m.  Wt Readings from Last 3 Encounters:  06/11/20 200 lb (90.7 kg)  06/30/19 211 lb 3.2 oz (95.8 kg)  02/27/19 195 lb (88.5 kg)    General: Patient appears comfortable at rest. Neck: Supple, no elevated JVP or carotid bruits, no thyromegaly. Lungs: Clear to auscultation, nonlabored breathing at rest. Cardiac: Regular rate and rhythm, no S3 or significant systolic murmur, no pericardial rub. Extremities: No pitting edema, distal pulses 2+. Skin: Warm and dry. Musculoskeletal: No kyphosis. Neuropsychiatric:  Alert and oriented x3, affect grossly appropriate.  ECG:  An ECG dated 06/11/2020 was personally reviewed today and demonstrated:  Sinus rhythm with premature atrial complexes, incomplete right bundle branch block.  Cannot rule out inferior infarct age undetermined.  Rate of 77  Recent Labwork: No results found for requested labs within last 8760 hours.     Component Value Date/Time   CHOL 189 11/17/2016 0211   TRIG 115 11/17/2016 0211   HDL 31 (L) 11/17/2016 0211   CHOLHDL 6.1 11/17/2016 0211   VLDL 23 11/17/2016 0211   LDLCALC 135 (H) 11/17/2016 0211    Other Studies Reviewed Today:  Diagnostic Studies  11/2016 cath  Mid RCA lesion, 70 %stenosed.  RPDA lesion, 95 %stenosed.  Prox Cx to Mid Cx lesion, 50 %stenosed.  Prox LAD lesion, 90 %stenosed.  Mid LAD lesion, 50 %stenosed.  Ost 2nd Diag lesion, 90 %stenosed.  There is mild to moderate left ventricular systolic dysfunction.  LV end diastolic pressure is normal.  1. Severe 3 vessel obstructive CAD 2. Mild to moderate LV dysfunction 3. 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 The patient is now pain free and ST elevation is improved. He has complex 3 vessel disease with ulcerated plaque in the proximal LAD and bifurcation disease at the take off of a large diagonal branch. Given the complexity of disease I think he would be best managed with CABG. Will maximize his medical therapy and consult CT surgery.   12/2016 echo Study Conclusions  - Left ventricle: The cavity size was normal. Wall thickness was normal. Systolic function was normal. The estimated ejection fraction was in the range of 55% to 60%. Left ventricular diastolic function parameters were normal. - Regional wall motion abnormality: Mild hypokinesis of the mid anteroseptal myocardium. - Aortic valve: Mildly calcified annulus. Trileaflet; mildly thickened leaflets. There was mild regurgitation. Valve  area (VTI): 3.04 cm^2. Valve area (Vmax): 3.23 cm^2. Valve area (Vmean): 2.67 cm^2. - Mitral valve: Mildly calcified annulus. Mildly thickened leaflets . There was mild regurgitation. - Technically adequate study.   Assessment and Plan:  1. CAD in native artery   2. Mixed hyperlipidemia    1. CAD in native artery CAD is stable.  He has no anginal or exertional symptoms.  Continue aspirin 81 mg.  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  2. Mixed hyperlipidemia Continue atorvastatin 80 mg daily.  No recent statin lab work from PCP.  We will attempt to obtain labs from PCP  Medication Adjustments/Labs and Tests Ordered: Current medicines are reviewed  at length with the patient today.  Concerns regarding medicines are outlined above.   Disposition: Follow-up with Dr. Wyline Mood or APP 1 year  Signed, Rennis Harding, NP 06/11/2020 9:36 AM    [facility] at [facility], [place] [address] [place], [place], [place] [address] Phone: [phone]; Fax: [phone]

## 2020-06-11 ENCOUNTER — Other Ambulatory Visit: Payer: Self-pay

## 2020-06-11 ENCOUNTER — Ambulatory Visit: Payer: Medicare PPO | Admitting: Family Medicine

## 2020-06-11 ENCOUNTER — Encounter: Payer: Self-pay | Admitting: Family Medicine

## 2020-06-11 ENCOUNTER — Encounter: Payer: Self-pay | Admitting: *Deleted

## 2020-06-11 VITALS — BP 122/80 | HR 76 | Ht 69.0 in | Wt 200.0 lb

## 2020-06-11 DIAGNOSIS — E782 Mixed hyperlipidemia: Secondary | ICD-10-CM | POA: Diagnosis not present

## 2020-06-11 DIAGNOSIS — I2510 Atherosclerotic heart disease of native coronary artery without angina pectoris: Secondary | ICD-10-CM

## 2020-06-11 NOTE — Patient Instructions (Signed)

## 2020-06-15 NOTE — Addendum Note (Signed)
Addended by: Eustace Moore on: 06/15/2020 03:13 PM   Modules accepted: Orders

## 2020-10-27 DIAGNOSIS — I2510 Atherosclerotic heart disease of native coronary artery without angina pectoris: Secondary | ICD-10-CM | POA: Diagnosis not present

## 2020-10-27 DIAGNOSIS — R5383 Other fatigue: Secondary | ICD-10-CM | POA: Diagnosis not present

## 2020-10-27 DIAGNOSIS — D649 Anemia, unspecified: Secondary | ICD-10-CM | POA: Diagnosis not present

## 2020-10-27 DIAGNOSIS — Z6829 Body mass index (BMI) 29.0-29.9, adult: Secondary | ICD-10-CM | POA: Diagnosis not present

## 2020-10-27 DIAGNOSIS — E7849 Other hyperlipidemia: Secondary | ICD-10-CM | POA: Diagnosis not present

## 2020-10-27 DIAGNOSIS — Z0000 Encounter for general adult medical examination without abnormal findings: Secondary | ICD-10-CM | POA: Diagnosis not present

## 2020-10-27 DIAGNOSIS — Z125 Encounter for screening for malignant neoplasm of prostate: Secondary | ICD-10-CM | POA: Diagnosis not present

## 2020-10-27 DIAGNOSIS — I10 Essential (primary) hypertension: Secondary | ICD-10-CM | POA: Diagnosis not present

## 2020-11-08 DIAGNOSIS — I714 Abdominal aortic aneurysm, without rupture: Secondary | ICD-10-CM | POA: Diagnosis not present

## 2020-11-08 DIAGNOSIS — I7409 Other arterial embolism and thrombosis of abdominal aorta: Secondary | ICD-10-CM | POA: Diagnosis not present

## 2020-11-08 DIAGNOSIS — K7689 Other specified diseases of liver: Secondary | ICD-10-CM | POA: Diagnosis not present

## 2021-04-16 DIAGNOSIS — H40033 Anatomical narrow angle, bilateral: Secondary | ICD-10-CM | POA: Diagnosis not present

## 2021-04-16 DIAGNOSIS — H2513 Age-related nuclear cataract, bilateral: Secondary | ICD-10-CM | POA: Diagnosis not present

## 2021-04-27 DIAGNOSIS — N181 Chronic kidney disease, stage 1: Secondary | ICD-10-CM | POA: Diagnosis not present

## 2021-04-27 DIAGNOSIS — E7849 Other hyperlipidemia: Secondary | ICD-10-CM | POA: Diagnosis not present

## 2021-04-27 DIAGNOSIS — I2510 Atherosclerotic heart disease of native coronary artery without angina pectoris: Secondary | ICD-10-CM | POA: Diagnosis not present

## 2021-04-27 DIAGNOSIS — Z6829 Body mass index (BMI) 29.0-29.9, adult: Secondary | ICD-10-CM | POA: Diagnosis not present

## 2021-04-27 DIAGNOSIS — I10 Essential (primary) hypertension: Secondary | ICD-10-CM | POA: Diagnosis not present

## 2021-04-27 DIAGNOSIS — Z0000 Encounter for general adult medical examination without abnormal findings: Secondary | ICD-10-CM | POA: Diagnosis not present

## 2021-06-08 DIAGNOSIS — E7849 Other hyperlipidemia: Secondary | ICD-10-CM | POA: Diagnosis not present

## 2021-06-08 DIAGNOSIS — N181 Chronic kidney disease, stage 1: Secondary | ICD-10-CM | POA: Diagnosis not present

## 2021-06-08 DIAGNOSIS — I10 Essential (primary) hypertension: Secondary | ICD-10-CM | POA: Diagnosis not present

## 2021-08-08 DIAGNOSIS — I10 Essential (primary) hypertension: Secondary | ICD-10-CM | POA: Diagnosis not present

## 2021-08-08 DIAGNOSIS — N181 Chronic kidney disease, stage 1: Secondary | ICD-10-CM | POA: Diagnosis not present

## 2021-08-08 DIAGNOSIS — E7849 Other hyperlipidemia: Secondary | ICD-10-CM | POA: Diagnosis not present

## 2021-10-07 DIAGNOSIS — I10 Essential (primary) hypertension: Secondary | ICD-10-CM | POA: Diagnosis not present

## 2021-10-07 DIAGNOSIS — E7849 Other hyperlipidemia: Secondary | ICD-10-CM | POA: Diagnosis not present

## 2021-10-07 DIAGNOSIS — N181 Chronic kidney disease, stage 1: Secondary | ICD-10-CM | POA: Diagnosis not present

## 2021-12-05 ENCOUNTER — Telehealth: Payer: Self-pay | Admitting: Cardiology

## 2021-12-05 NOTE — Telephone Encounter (Signed)
FYI.  °Contacted patient regarding recall appointment, patient notified our office they did not wish to keep this appointment at this time.  Deleted recall from system. °

## 2023-01-19 ENCOUNTER — Telehealth: Payer: Self-pay | Admitting: Cardiology

## 2023-01-19 NOTE — Telephone Encounter (Signed)
Misty Stanley with [facility] is calling regarding an echo order, that she states was sent via fax to [phone] as well as [phone] on 01/12/23. Misty Stanley can be reached at [phone], ext [phone]. Please advise.

## 2023-01-22 ENCOUNTER — Other Ambulatory Visit: Payer: Self-pay | Admitting: Nurse Practitioner

## 2023-01-22 DIAGNOSIS — R0602 Shortness of breath: Secondary | ICD-10-CM

## 2023-02-15 ENCOUNTER — Ambulatory Visit: Payer: No Typology Code available for payment source | Attending: Nurse Practitioner

## 2023-02-15 DIAGNOSIS — R0602 Shortness of breath: Secondary | ICD-10-CM | POA: Diagnosis not present

## 2023-02-15 LAB — ECHOCARDIOGRAM COMPLETE
AR max vel: 2.61 cm2
AV Area VTI: 2.76 cm2
AV Area mean vel: 2.64 cm2
AV Mean grad: 3 mmHg
AV Peak grad: 5.8 mmHg
Ao pk vel: 1.2 m/s
Area-P 1/2: 2.66 cm2
Calc EF: 52.3 %
Est EF: 55
MV M vel: 2.31 m/s
MV Peak grad: 21.3 mmHg
S' Lateral: 3.8 cm
Single Plane A2C EF: 53.6 %
Single Plane A4C EF: 52.4 %

## 2023-02-16 ENCOUNTER — Telehealth: Payer: Self-pay | Admitting: Cardiology

## 2023-02-16 NOTE — Telephone Encounter (Signed)
Requesting a copy of patient Echo be sent to their office. Fax [phone]. Please advise

## 2023-03-13 ENCOUNTER — Ambulatory Visit: Payer: No Typology Code available for payment source | Attending: Nurse Practitioner | Admitting: Nurse Practitioner

## 2023-03-13 ENCOUNTER — Encounter: Payer: Self-pay | Admitting: *Deleted

## 2023-03-13 ENCOUNTER — Encounter: Payer: Self-pay | Admitting: Nurse Practitioner

## 2023-03-13 VITALS — BP 138/86 | HR 78 | Ht 69.0 in | Wt 206.4 lb

## 2023-03-13 DIAGNOSIS — I4891 Unspecified atrial fibrillation: Secondary | ICD-10-CM

## 2023-03-13 DIAGNOSIS — E782 Mixed hyperlipidemia: Secondary | ICD-10-CM

## 2023-03-13 DIAGNOSIS — R0781 Pleurodynia: Secondary | ICD-10-CM | POA: Diagnosis not present

## 2023-03-13 DIAGNOSIS — I2510 Atherosclerotic heart disease of native coronary artery without angina pectoris: Secondary | ICD-10-CM

## 2023-03-13 DIAGNOSIS — N183 Chronic kidney disease, stage 3 unspecified: Secondary | ICD-10-CM

## 2023-03-13 MED ORDER — NITROGLYCERIN 0.4 MG SL SUBL: 0.4000 mg | SUBLINGUAL_TABLET | SUBLINGUAL | 3 refills | Status: AC | PRN

## 2023-03-13 NOTE — Patient Instructions (Signed)
Medication Instructions:   Begin Nitroglycerin as needed  Continue all other medications.     Labwork:  none  Testing/Procedures:  none  Follow-Up:  6 months   Any Other Special Instructions Will Be Listed Below (If Applicable).  Salty six Mediterranean diet infor   If you need a refill on your cardiac medications before your next appointment, please call your pharmacy.

## 2023-03-13 NOTE — Progress Notes (Unsigned)
Office Visit    Patient Name: Dennis Paul Date of Encounter: 03/13/2023  PCP:  Toma Deiters, MD   [facility]  Cardiologist:  Dina Rich, MD *** Advanced Practice Provider:  No care team member to display Electrophysiologist:  None  {Press F2 to show EP APP, CHF, sleep or structural heart MD               :[id number]}  { Click here to update then REFRESH NOTE - MD (PCP) or APP (Team Member)  Change PCP Type for MD, Specialty for APP is either Cardiology or Clinical Cardiac Electrophysiology  :[id number]}  Chief Complaint    Dennis Paul is a 72 y.o. male with a hx of CAD, s/p STEMI in 2018, status post CABG x 4, hypertension, mixed hyperlipidemia, GERD, and CKD stage Paul who presents today for overdue follow-up.  Past Medical History    Past Medical History:  Diagnosis Date   Arthritis    Asthma    had as a child, wheezes now w/ URI   Coronary artery disease    Dysrhythmia    afib post op none now    GERD (gastroesophageal reflux disease)    History of hiatal hernia    HTN (hypertension)    no longer takes none since 11/2016 discontinued by cardiologist    Myocardial infarction ([facility])    PONV (postoperative nausea and vomiting)    Past Surgical History:  Procedure Laterality Date   CORONARY ARTERY BYPASS GRAFT N/A 11/17/2016   Procedure: CORONARY ARTERY BYPASS GRAFTING (CABG)x4   LIMA-LAD DVG-OM SVG-PD SVG-DIAG;  Surgeon: Loreli Slot, MD;  Location[facility];  Service: Open Heart Surgery;  Laterality: N/A;   CYSTOSCOPY WITH INSERTION OF UROLIFT N/A 02/27/2017   Procedure: CYSTOSCOPY WITH INSERTION OF UROLIFT;  Surgeon: Jerilee Field, MD;  [facility];  Service: Urology;  Laterality: N/A;   LEFT HEART CATH AND CORONARY ANGIOGRAPHY N/A 11/16/2016   Procedure: Left Heart Cath and Coronary Angiography;  Surgeon: Peter M Swaziland, MD;  Location: [facility] INVASIVE CV LAB;  Service: Cardiovascular;  Laterality: N/A;   TEE WITHOUT  CARDIOVERSION N/A 11/17/2016   Procedure: TRANSESOPHAGEAL ECHOCARDIOGRAM (TEE);  Surgeon: Loreli Slot, MD;  Location: [facility] OR;  Service: Open Heart Surgery;  Laterality: N/A;    Allergies  No Known Allergies  History of Present Illness    Dennis Paul is a 72 y.o. male with a MHS mentioned above.  Previous cardiovascular history includes STEMI in 2018, status post CABG in 2019 (LIMA-LAD, SVG-OM, SVG-D2, RIMA-PDA).  Last seen by Dr. Dina Rich in 2020.  Did admit to some musculoskeletal left arm pain, overall was doing well.  Seen by Rennis Harding, NP on June 11, 2020.  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  He was doing well from a cardiac perspective.  Today he presents for overdue 1 year follow-up.  He states EKGs/Labs/Other Studies Reviewed:   The following studies were reviewed today: ***  EKG:  EKG is *** ordered today.  The ekg ordered today demonstrates ***  Recent Labs: No results found for requested labs within last 365 days.  Recent Lipid Panel    Component Value Date/Time   CHOL 189 11/17/2016 0211   TRIG 115 11/17/2016 0211   HDL 31 (L) 11/17/2016 0211   CHOLHDL 6.1 11/17/2016 0211   VLDL 23 11/17/2016 0211   LDLCALC 135 (H) 11/17/2016 0211  Risk Assessment/Calculations:  {Does this patient have ATRIAL FIBRILLATION?:[phone]}  Home Medications   No outpatient medications have been marked as taking for the 03/13/23 encounter (Appointment) with Sharlene Dory, NP.     Review of Systems   ***   All other systems reviewed and are otherwise negative except as noted above.  Physical Exam    VS:  There were no vitals taken for this visit. , BMI There is no height or weight on file to calculate BMI.  Wt Readings from Last 3 Encounters:  06/11/20 200 lb (90.7 kg)  06/30/19 211 lb 3.2 oz (95.8 kg)  02/27/19 195 lb (88.5 kg)     GEN: Well nourished, well developed, in no acute  distress. HEENT: normal. Neck: Supple, no JVD, carotid bruits, or masses. Cardiac: ***RRR, no murmurs, rubs, or gallops. No clubbing, cyanosis, edema.  ***Radials/PT 2+ and equal bilaterally.  Respiratory:  ***Respirations regular and unlabored, clear to auscultation bilaterally. GI: Soft, nontender, nondistended. MS: No deformity or atrophy. Skin: Warm and dry, no rash. Neuro:  Strength and sensation are intact. Psych: Normal affect.  Assessment & Plan    ***  {Are you ordering a CV Procedure (e.g. stress test, cath, DCCV, TEE, etc)?   Press F2        :[id number]}      Disposition: Follow up {follow up:15908} with Dina Rich, MD or APP.  Signed, Sharlene Dory, NP 03/13/2023, 1:00 PM [facility]

## 2023-04-25 DIAGNOSIS — R109 Unspecified abdominal pain: Secondary | ICD-10-CM | POA: Diagnosis not present

## 2023-04-25 DIAGNOSIS — K921 Melena: Secondary | ICD-10-CM | POA: Diagnosis not present

## 2023-09-17 ENCOUNTER — Ambulatory Visit: Payer: No Typology Code available for payment source | Admitting: Cardiology

## 2023-09-18 ENCOUNTER — Ambulatory Visit: Payer: No Typology Code available for payment source | Admitting: Cardiology
